# Patient Record
Sex: Female | Born: 1965 | Race: Black or African American | Hispanic: No | Marital: Married | Smoking: Never smoker
Health system: Southern US, Community
[De-identification: ages and names within clinical notes are randomized; demographics above are authoritative.]

## PROBLEM LIST (undated history)

## (undated) DIAGNOSIS — Z8711 Personal history of peptic ulcer disease: Secondary | ICD-10-CM

## (undated) DIAGNOSIS — B019 Varicella without complication: Secondary | ICD-10-CM

## (undated) DIAGNOSIS — G40109 Localization-related (focal) (partial) symptomatic epilepsy and epileptic syndromes with simple partial seizures, not intractable, without status epilepticus: Secondary | ICD-10-CM

## (undated) DIAGNOSIS — E119 Type 2 diabetes mellitus without complications: Secondary | ICD-10-CM

## (undated) DIAGNOSIS — G64 Other disorders of peripheral nervous system: Secondary | ICD-10-CM

## (undated) DIAGNOSIS — Z9189 Other specified personal risk factors, not elsewhere classified: Secondary | ICD-10-CM

## (undated) DIAGNOSIS — D219 Benign neoplasm of connective and other soft tissue, unspecified: Secondary | ICD-10-CM

## (undated) DIAGNOSIS — M1990 Unspecified osteoarthritis, unspecified site: Secondary | ICD-10-CM

## (undated) DIAGNOSIS — F319 Bipolar disorder, unspecified: Secondary | ICD-10-CM

## (undated) DIAGNOSIS — F32A Depression, unspecified: Secondary | ICD-10-CM

## (undated) DIAGNOSIS — Z8719 Personal history of other diseases of the digestive system: Secondary | ICD-10-CM

## (undated) DIAGNOSIS — F329 Major depressive disorder, single episode, unspecified: Secondary | ICD-10-CM

## (undated) DIAGNOSIS — G4730 Sleep apnea, unspecified: Secondary | ICD-10-CM

## (undated) DIAGNOSIS — E7800 Pure hypercholesterolemia, unspecified: Secondary | ICD-10-CM

## (undated) DIAGNOSIS — M549 Dorsalgia, unspecified: Secondary | ICD-10-CM

## (undated) DIAGNOSIS — R569 Unspecified convulsions: Secondary | ICD-10-CM

## (undated) DIAGNOSIS — Z5189 Encounter for other specified aftercare: Secondary | ICD-10-CM

## (undated) HISTORY — DX: Localization-related (focal) (partial) symptomatic epilepsy and epileptic syndromes with simple partial seizures, not intractable, without status epilepticus: G40.109

## (undated) HISTORY — DX: Encounter for other specified aftercare: Z51.89

## (undated) HISTORY — DX: Major depressive disorder, single episode, unspecified: F32.9

## (undated) HISTORY — DX: Type 2 diabetes mellitus without complications: E11.9

## (undated) HISTORY — PX: KNEE SURGERY: SHX244

## (undated) HISTORY — DX: Benign neoplasm of connective and other soft tissue, unspecified: D21.9

## (undated) HISTORY — DX: Varicella without complication: B01.9

## (undated) HISTORY — PX: PARTIAL HYSTERECTOMY: SHX80

## (undated) HISTORY — DX: Personal history of other diseases of the digestive system: Z87.19

## (undated) HISTORY — DX: Dorsalgia, unspecified: M54.9

## (undated) HISTORY — DX: Personal history of peptic ulcer disease: Z87.11

## (undated) HISTORY — PX: CHOLECYSTECTOMY: SHX55

## (undated) HISTORY — DX: Unspecified convulsions: R56.9

## (undated) HISTORY — PX: GASTRIC BYPASS: SHX52

## (undated) HISTORY — DX: Other disorders of peripheral nervous system: G64

## (undated) HISTORY — DX: Depression, unspecified: F32.A

## (undated) HISTORY — DX: Sleep apnea, unspecified: G47.30

## (undated) HISTORY — DX: Other specified personal risk factors, not elsewhere classified: Z91.89

## (undated) HISTORY — DX: Unspecified osteoarthritis, unspecified site: M19.90

---

## 2010-08-11 HISTORY — PX: ABDOMINAL HYSTERECTOMY: SHX81

## 2010-11-12 DIAGNOSIS — E1140 Type 2 diabetes mellitus with diabetic neuropathy, unspecified: Secondary | ICD-10-CM | POA: Insufficient documentation

## 2010-11-12 DIAGNOSIS — D219 Benign neoplasm of connective and other soft tissue, unspecified: Secondary | ICD-10-CM

## 2010-11-12 DIAGNOSIS — G629 Polyneuropathy, unspecified: Secondary | ICD-10-CM | POA: Insufficient documentation

## 2010-11-12 DIAGNOSIS — G64 Other disorders of peripheral nervous system: Secondary | ICD-10-CM | POA: Insufficient documentation

## 2010-11-12 DIAGNOSIS — G4730 Sleep apnea, unspecified: Secondary | ICD-10-CM

## 2010-11-12 DIAGNOSIS — K862 Cyst of pancreas: Secondary | ICD-10-CM | POA: Insufficient documentation

## 2010-11-12 DIAGNOSIS — E119 Type 2 diabetes mellitus without complications: Secondary | ICD-10-CM

## 2010-11-12 DIAGNOSIS — F410 Panic disorder [episodic paroxysmal anxiety] without agoraphobia: Secondary | ICD-10-CM | POA: Insufficient documentation

## 2010-11-12 HISTORY — DX: Sleep apnea, unspecified: G47.30

## 2010-11-12 HISTORY — DX: Benign neoplasm of connective and other soft tissue, unspecified: D21.9

## 2010-11-12 HISTORY — DX: Other disorders of peripheral nervous system: G64

## 2010-11-12 HISTORY — DX: Type 2 diabetes mellitus without complications: E11.9

## 2011-07-29 DIAGNOSIS — G40109 Localization-related (focal) (partial) symptomatic epilepsy and epileptic syndromes with simple partial seizures, not intractable, without status epilepticus: Secondary | ICD-10-CM | POA: Insufficient documentation

## 2011-07-29 HISTORY — DX: Localization-related (focal) (partial) symptomatic epilepsy and epileptic syndromes with simple partial seizures, not intractable, without status epilepticus: G40.109

## 2011-10-21 DIAGNOSIS — Z01818 Encounter for other preprocedural examination: Secondary | ICD-10-CM | POA: Insufficient documentation

## 2011-10-21 DIAGNOSIS — R000 Tachycardia, unspecified: Secondary | ICD-10-CM | POA: Insufficient documentation

## 2011-10-21 DIAGNOSIS — R002 Palpitations: Secondary | ICD-10-CM | POA: Insufficient documentation

## 2011-11-16 DIAGNOSIS — Z79899 Other long term (current) drug therapy: Secondary | ICD-10-CM | POA: Insufficient documentation

## 2011-12-14 DIAGNOSIS — E559 Vitamin D deficiency, unspecified: Secondary | ICD-10-CM | POA: Insufficient documentation

## 2012-05-02 DIAGNOSIS — Z9884 Bariatric surgery status: Secondary | ICD-10-CM | POA: Insufficient documentation

## 2012-06-06 ENCOUNTER — Emergency Department: Payer: Self-pay | Admitting: Emergency Medicine

## 2012-06-06 LAB — URINALYSIS, COMPLETE
Bilirubin,UR: NEGATIVE
Hyaline Cast: 15
Leukocyte Esterase: NEGATIVE
Nitrite: NEGATIVE
Ph: 5 (ref 4.5–8.0)
Protein: NEGATIVE
RBC,UR: 1 /HPF (ref 0–5)
Specific Gravity: 1.019 (ref 1.003–1.030)
Squamous Epithelial: 4

## 2012-06-06 LAB — COMPREHENSIVE METABOLIC PANEL
Albumin: 3.7 g/dL (ref 3.4–5.0)
Anion Gap: 8 (ref 7–16)
Bilirubin,Total: 0.3 mg/dL (ref 0.2–1.0)
Co2: 27 mmol/L (ref 21–32)
Creatinine: 1.66 mg/dL — ABNORMAL HIGH (ref 0.60–1.30)
EGFR (Non-African Amer.): 37 — ABNORMAL LOW
Potassium: 3.9 mmol/L (ref 3.5–5.1)
SGPT (ALT): 37 U/L (ref 12–78)

## 2012-06-06 LAB — CBC WITH DIFFERENTIAL/PLATELET
Basophil #: 0 10*3/uL (ref 0.0–0.1)
Basophil %: 0.5 %
Eosinophil #: 0.1 10*3/uL (ref 0.0–0.7)
HCT: 35.1 % (ref 35.0–47.0)
HGB: 11.6 g/dL — ABNORMAL LOW (ref 12.0–16.0)
Lymphocyte #: 3.1 10*3/uL (ref 1.0–3.6)
MCH: 29 pg (ref 26.0–34.0)
Monocyte %: 7.1 %
Neutrophil %: 56.4 %
Platelet: 378 10*3/uL (ref 150–440)
RBC: 4 10*6/uL (ref 3.80–5.20)
WBC: 8.9 10*3/uL (ref 3.6–11.0)

## 2012-06-06 LAB — PREGNANCY, URINE: Pregnancy Test, Urine: NEGATIVE m[IU]/mL

## 2012-07-10 LAB — COMPREHENSIVE METABOLIC PANEL
Alkaline Phosphatase: 94 U/L (ref 50–136)
Anion Gap: 10 (ref 7–16)
Bilirubin,Total: 0.2 mg/dL (ref 0.2–1.0)
Calcium, Total: 9.1 mg/dL (ref 8.5–10.1)
Chloride: 106 mmol/L (ref 98–107)
Co2: 22 mmol/L (ref 21–32)
Osmolality: 281 (ref 275–301)
Potassium: 3.7 mmol/L (ref 3.5–5.1)
SGPT (ALT): 45 U/L (ref 12–78)
Total Protein: 8.2 g/dL (ref 6.4–8.2)

## 2012-07-10 LAB — CBC
HCT: 38.9 % (ref 35.0–47.0)
RBC: 4.38 10*6/uL (ref 3.80–5.20)
RDW: 15.5 % — ABNORMAL HIGH (ref 11.5–14.5)

## 2012-07-10 LAB — TROPONIN I: Troponin-I: <0.02 ng/mL

## 2012-07-11 ENCOUNTER — Inpatient Hospital Stay: Payer: Self-pay | Admitting: Internal Medicine

## 2012-07-11 DIAGNOSIS — I509 Heart failure, unspecified: Secondary | ICD-10-CM

## 2012-07-11 LAB — URINALYSIS, COMPLETE
Bilirubin,UR: NEGATIVE
Blood: NEGATIVE
Ketone: NEGATIVE
Leukocyte Esterase: NEGATIVE
Nitrite: NEGATIVE
Ph: 5 (ref 4.5–8.0)
RBC,UR: <1 /HPF (ref 0–5)
Specific Gravity: 1.014 (ref 1.003–1.030)
WBC UR: 4 /HPF (ref 0–5)

## 2012-07-11 LAB — TSH: Thyroid Stimulating Horm: 2.26 u[IU]/mL

## 2012-07-11 LAB — PROTEIN / CREATININE RATIO, URINE
Creatinine, Urine: 215 mg/dL — ABNORMAL HIGH (ref 30.0–125.0)
Protein, Random Urine: 45 mg/dL — ABNORMAL HIGH (ref 0–12)
Protein/Creat. Ratio: 209 mg/gCREAT — ABNORMAL HIGH (ref 0–200)

## 2012-07-12 LAB — CBC WITH DIFFERENTIAL/PLATELET
Basophil #: 0 10*3/uL (ref 0.0–0.1)
Basophil %: 0.2 %
Eosinophil #: 0 10*3/uL (ref 0.0–0.7)
Eosinophil %: 0 %
HCT: 34.4 % — ABNORMAL LOW (ref 35.0–47.0)
Lymphocyte #: 1.3 10*3/uL (ref 1.0–3.6)
MCV: 89 fL (ref 80–100)
Monocyte %: 3 %
Neutrophil #: 7 10*3/uL — ABNORMAL HIGH (ref 1.4–6.5)
Neutrophil %: 82 %
Platelet: 388 10*3/uL (ref 150–440)
RDW: 15.4 % — ABNORMAL HIGH (ref 11.5–14.5)

## 2012-07-12 LAB — BASIC METABOLIC PANEL
Anion Gap: 6 — ABNORMAL LOW (ref 7–16)
Chloride: 108 mmol/L — ABNORMAL HIGH (ref 98–107)
Co2: 23 mmol/L (ref 21–32)
Creatinine: 1.32 mg/dL — ABNORMAL HIGH (ref 0.60–1.30)
EGFR (African American): 57 — ABNORMAL LOW
Glucose: 184 mg/dL — ABNORMAL HIGH (ref 65–99)
Osmolality: 282 (ref 275–301)
Potassium: 5.5 mmol/L — ABNORMAL HIGH (ref 3.5–5.1)
Sodium: 137 mmol/L (ref 136–145)

## 2012-07-12 LAB — HEMOGLOBIN A1C: Hemoglobin A1C: 6.6 % — ABNORMAL HIGH (ref 4.2–6.3)

## 2012-07-12 LAB — LIPID PANEL
Cholesterol: 205 mg/dL — ABNORMAL HIGH (ref 0–200)
Triglycerides: 150 mg/dL (ref 0–200)
VLDL Cholesterol, Calc: 30 mg/dL (ref 5–40)

## 2012-08-01 ENCOUNTER — Ambulatory Visit: Payer: Self-pay | Admitting: Pain Medicine

## 2012-08-03 LAB — COMPREHENSIVE METABOLIC PANEL
Albumin: 4.5 g/dL (ref 3.4–5.0)
Anion Gap: 14 (ref 7–16)
Bilirubin,Total: 0.4 mg/dL (ref 0.2–1.0)
Chloride: 95 mmol/L — ABNORMAL LOW (ref 98–107)
EGFR (African American): 53 — ABNORMAL LOW
Glucose: 166 mg/dL — ABNORMAL HIGH (ref 65–99)
Osmolality: 276 (ref 275–301)
Potassium: 3.3 mmol/L — ABNORMAL LOW (ref 3.5–5.1)
Sodium: 135 mmol/L — ABNORMAL LOW (ref 136–145)

## 2012-08-03 LAB — URINALYSIS, COMPLETE
Blood: NEGATIVE
Glucose,UR: NEGATIVE mg/dL (ref 0–75)
Hyaline Cast: 47
Leukocyte Esterase: NEGATIVE
RBC,UR: <1 /HPF (ref 0–5)
Specific Gravity: 1.025 (ref 1.003–1.030)
Squamous Epithelial: 4
WBC UR: 1 /HPF (ref 0–5)

## 2012-08-03 LAB — CBC
MCV: 88 fL (ref 80–100)
Platelet: 467 10*3/uL — ABNORMAL HIGH (ref 150–440)

## 2012-08-03 LAB — LIPASE, BLOOD: Lipase: 222 U/L (ref 73–393)

## 2012-08-04 ENCOUNTER — Inpatient Hospital Stay: Payer: Self-pay | Admitting: Internal Medicine

## 2012-08-09 ENCOUNTER — Ambulatory Visit: Payer: Self-pay | Admitting: Family Medicine

## 2012-08-19 ENCOUNTER — Ambulatory Visit: Payer: Self-pay | Admitting: Family Medicine

## 2012-09-14 ENCOUNTER — Emergency Department: Payer: Self-pay | Admitting: Emergency Medicine

## 2012-09-14 LAB — COMPREHENSIVE METABOLIC PANEL
BUN: 7 mg/dL (ref 7–18)
Bilirubin,Total: 0.2 mg/dL (ref 0.2–1.0)
Calcium, Total: 8.9 mg/dL (ref 8.5–10.1)
Chloride: 108 mmol/L — ABNORMAL HIGH (ref 98–107)
Creatinine: 0.88 mg/dL (ref 0.60–1.30)
EGFR (African American): >60
EGFR (Non-African Amer.): >60
Glucose: 68 mg/dL (ref 65–99)
Osmolality: 279 (ref 275–301)
Potassium: 4.5 mmol/L (ref 3.5–5.1)
SGPT (ALT): 65 U/L (ref 12–78)

## 2012-09-14 LAB — CBC
HCT: 38.1 % (ref 35.0–47.0)
HGB: 12.8 g/dL (ref 12.0–16.0)
MCH: 29.7 pg (ref 26.0–34.0)
MCHC: 33.5 g/dL (ref 32.0–36.0)
RDW: 15.8 % — ABNORMAL HIGH (ref 11.5–14.5)
WBC: 7.8 10*3/uL (ref 3.6–11.0)

## 2012-09-14 LAB — URINALYSIS, COMPLETE
Bilirubin,UR: NEGATIVE
Glucose,UR: NEGATIVE mg/dL (ref 0–75)
Leukocyte Esterase: NEGATIVE
Nitrite: NEGATIVE
Protein: NEGATIVE
RBC,UR: 1 /HPF (ref 0–5)
Specific Gravity: 1.009 (ref 1.003–1.030)

## 2012-09-14 LAB — TROPONIN I: Troponin-I: <0.02 ng/mL

## 2012-09-14 LAB — CK TOTAL AND CKMB (NOT AT ARMC)
CK, Total: 178 U/L (ref 21–215)
CK-MB: 1 ng/mL (ref 0.5–3.6)

## 2012-09-16 ENCOUNTER — Emergency Department: Payer: Self-pay | Admitting: Internal Medicine

## 2012-09-16 LAB — BASIC METABOLIC PANEL
Anion Gap: 6 — ABNORMAL LOW (ref 7–16)
Calcium, Total: 8.9 mg/dL (ref 8.5–10.1)
Chloride: 104 mmol/L (ref 98–107)
Creatinine: 0.96 mg/dL (ref 0.60–1.30)
EGFR (African American): >60
EGFR (Non-African Amer.): >60
Osmolality: 274 (ref 275–301)
Potassium: 4.2 mmol/L (ref 3.5–5.1)

## 2012-09-16 LAB — URINALYSIS, COMPLETE
Bilirubin,UR: NEGATIVE
Blood: NEGATIVE
Glucose,UR: NEGATIVE mg/dL (ref 0–75)
Ketone: NEGATIVE
Leukocyte Esterase: NEGATIVE
Ph: 6 (ref 4.5–8.0)
Protein: NEGATIVE
RBC,UR: <1 /HPF (ref 0–5)
Specific Gravity: 1.011 (ref 1.003–1.030)

## 2012-09-16 LAB — DRUG SCREEN, URINE
Barbiturates, Ur Screen: NEGATIVE (ref ?–200)
Benzodiazepine, Ur Scrn: NEGATIVE (ref ?–200)
Cocaine Metabolite,Ur ~~LOC~~: NEGATIVE (ref ?–300)
Methadone, Ur Screen: NEGATIVE (ref ?–300)
Opiate, Ur Screen: NEGATIVE (ref ?–300)
Phencyclidine (PCP) Ur S: NEGATIVE (ref ?–25)
Tricyclic, Ur Screen: POSITIVE (ref ?–1000)

## 2012-09-16 LAB — CBC
HCT: 38.4 % (ref 35.0–47.0)
HGB: 13 g/dL (ref 12.0–16.0)
MCH: 30.3 pg (ref 26.0–34.0)
Platelet: 462 10*3/uL — ABNORMAL HIGH (ref 150–440)

## 2012-09-16 LAB — TROPONIN I: Troponin-I: <0.02 ng/mL

## 2012-10-09 LAB — URINALYSIS, COMPLETE
Glucose,UR: 50 mg/dL (ref 0–75)
Ketone: NEGATIVE
Nitrite: NEGATIVE
Protein: 30
RBC,UR: 2 /HPF (ref 0–5)
Specific Gravity: 1.03 (ref 1.003–1.030)
Squamous Epithelial: 7
WBC UR: 6 /HPF (ref 0–5)

## 2012-10-09 LAB — COMPREHENSIVE METABOLIC PANEL
Albumin: 3.5 g/dL (ref 3.4–5.0)
Alkaline Phosphatase: 92 U/L (ref 50–136)
Anion Gap: 5 — ABNORMAL LOW (ref 7–16)
BUN: 13 mg/dL (ref 7–18)
Bilirubin,Total: 0.3 mg/dL (ref 0.2–1.0)
Calcium, Total: 8.6 mg/dL (ref 8.5–10.1)
EGFR (African American): >60
EGFR (Non-African Amer.): 56 — ABNORMAL LOW
Glucose: 86 mg/dL (ref 65–99)
Osmolality: 277 (ref 275–301)
Potassium: 3.2 mmol/L — ABNORMAL LOW (ref 3.5–5.1)
SGOT(AST): 39 U/L — ABNORMAL HIGH (ref 15–37)
Sodium: 139 mmol/L (ref 136–145)
Total Protein: 7.3 g/dL (ref 6.4–8.2)

## 2012-10-09 LAB — CBC
MCH: 30.1 pg (ref 26.0–34.0)
MCHC: 33.1 g/dL (ref 32.0–36.0)
MCV: 91 fL (ref 80–100)
Platelet: 348 10*3/uL (ref 150–440)
RBC: 3.75 10*6/uL — ABNORMAL LOW (ref 3.80–5.20)
WBC: 7.1 10*3/uL (ref 3.6–11.0)

## 2012-10-10 ENCOUNTER — Observation Stay: Payer: Self-pay | Admitting: Internal Medicine

## 2012-10-10 LAB — BASIC METABOLIC PANEL
Anion Gap: 4 — ABNORMAL LOW (ref 7–16)
BUN: 11 mg/dL (ref 7–18)
Calcium, Total: 8.7 mg/dL (ref 8.5–10.1)
Creatinine: 1.1 mg/dL (ref 0.60–1.30)
EGFR (African American): >60
EGFR (Non-African Amer.): >60
Sodium: 140 mmol/L (ref 136–145)

## 2012-10-11 LAB — URINE CULTURE

## 2012-10-20 ENCOUNTER — Emergency Department: Payer: Self-pay | Admitting: Emergency Medicine

## 2012-10-20 LAB — COMPREHENSIVE METABOLIC PANEL
BUN: 14 mg/dL (ref 7–18)
Bilirubin,Total: 0.2 mg/dL (ref 0.2–1.0)
Calcium, Total: 8.8 mg/dL (ref 8.5–10.1)
Co2: 28 mmol/L (ref 21–32)
EGFR (African American): >60
EGFR (Non-African Amer.): >60
Osmolality: 284 (ref 275–301)
SGPT (ALT): 44 U/L (ref 12–78)
Total Protein: 7.1 g/dL (ref 6.4–8.2)

## 2012-10-20 LAB — CBC
HCT: 33.5 % — ABNORMAL LOW (ref 35.0–47.0)
HGB: 11.5 g/dL — ABNORMAL LOW (ref 12.0–16.0)
MCH: 30.9 pg (ref 26.0–34.0)
MCHC: 34.2 g/dL (ref 32.0–36.0)
RBC: 3.71 10*6/uL — ABNORMAL LOW (ref 3.80–5.20)
RDW: 13.9 % (ref 11.5–14.5)

## 2012-10-20 LAB — URINALYSIS, COMPLETE
Blood: NEGATIVE
Glucose,UR: NEGATIVE mg/dL (ref 0–75)
Leukocyte Esterase: NEGATIVE
Nitrite: NEGATIVE
RBC,UR: 1 /HPF (ref 0–5)
WBC UR: <1 /HPF (ref 0–5)

## 2012-10-20 LAB — CK TOTAL AND CKMB (NOT AT ARMC): CK, Total: 311 U/L — ABNORMAL HIGH (ref 21–215)

## 2012-10-20 LAB — TROPONIN I: Troponin-I: <0.02 ng/mL

## 2012-11-18 ENCOUNTER — Emergency Department: Payer: Self-pay | Admitting: Emergency Medicine

## 2012-11-18 LAB — BASIC METABOLIC PANEL
Anion Gap: 9 (ref 7–16)
BUN: 9 mg/dL (ref 7–18)
Calcium, Total: 9.1 mg/dL (ref 8.5–10.1)
Chloride: 104 mmol/L (ref 98–107)
EGFR (African American): >60
EGFR (Non-African Amer.): 56 — ABNORMAL LOW
Glucose: 238 mg/dL — ABNORMAL HIGH (ref 65–99)
Osmolality: 277 (ref 275–301)

## 2012-11-18 LAB — CBC
HGB: 12.2 g/dL (ref 12.0–16.0)
MCH: 30.3 pg (ref 26.0–34.0)
MCHC: 33.8 g/dL (ref 32.0–36.0)
MCV: 90 fL (ref 80–100)
RBC: 4.02 10*6/uL (ref 3.80–5.20)

## 2012-11-18 LAB — TROPONIN I: Troponin-I: <0.02 ng/mL

## 2012-11-19 LAB — TROPONIN I: Troponin-I: <0.02 ng/mL

## 2012-12-08 ENCOUNTER — Emergency Department: Payer: Self-pay | Admitting: Emergency Medicine

## 2012-12-08 LAB — COMPREHENSIVE METABOLIC PANEL
Albumin: 3.8 g/dL (ref 3.4–5.0)
BUN: 14 mg/dL (ref 7–18)
Bilirubin,Total: 0.3 mg/dL (ref 0.2–1.0)
Calcium, Total: 9.1 mg/dL (ref 8.5–10.1)
Chloride: 107 mmol/L (ref 98–107)
Co2: 26 mmol/L (ref 21–32)
Creatinine: 1.17 mg/dL (ref 0.60–1.30)
EGFR (Non-African Amer.): 56 — ABNORMAL LOW
SGOT(AST): 125 U/L — ABNORMAL HIGH (ref 15–37)
SGPT (ALT): 99 U/L — ABNORMAL HIGH (ref 12–78)
Sodium: 137 mmol/L (ref 136–145)
Total Protein: 8.4 g/dL — ABNORMAL HIGH (ref 6.4–8.2)

## 2012-12-08 LAB — CBC
HCT: 37.3 % (ref 35.0–47.0)
HGB: 12.5 g/dL (ref 12.0–16.0)
MCH: 29.8 pg (ref 26.0–34.0)
MCV: 89 fL (ref 80–100)
RDW: 13.3 % (ref 11.5–14.5)

## 2012-12-08 LAB — URINALYSIS, COMPLETE
Bilirubin,UR: NEGATIVE
Glucose,UR: NEGATIVE mg/dL (ref 0–75)
Ketone: NEGATIVE
Protein: NEGATIVE
RBC,UR: <1 /HPF (ref 0–5)

## 2013-01-30 ENCOUNTER — Ambulatory Visit: Payer: Self-pay | Admitting: Pain Medicine

## 2013-02-28 LAB — BASIC METABOLIC PANEL
Anion Gap: 6 — ABNORMAL LOW (ref 7–16)
BUN: 12 mg/dL (ref 7–18)
Calcium, Total: 8.9 mg/dL (ref 8.5–10.1)
Chloride: 101 mmol/L (ref 98–107)
Creatinine: 1.17 mg/dL (ref 0.60–1.30)
EGFR (Non-African Amer.): 56 — ABNORMAL LOW
Glucose: 282 mg/dL — ABNORMAL HIGH (ref 65–99)
Potassium: 3.9 mmol/L (ref 3.5–5.1)
Sodium: 135 mmol/L — ABNORMAL LOW (ref 136–145)

## 2013-02-28 LAB — CBC WITH DIFFERENTIAL/PLATELET
Basophil #: 0 10*3/uL (ref 0.0–0.1)
Basophil %: 0.3 %
Eosinophil %: 0.3 %
HCT: 35.6 % (ref 35.0–47.0)
HGB: 11.9 g/dL — ABNORMAL LOW (ref 12.0–16.0)
Lymphocyte %: 14 %
MCH: 30.2 pg (ref 26.0–34.0)
MCHC: 33.5 g/dL (ref 32.0–36.0)
MCV: 90 fL (ref 80–100)
Monocyte #: 0.6 x10 3/mm (ref 0.2–0.9)
Monocyte %: 5.2 %
Neutrophil %: 80.2 %
RDW: 13.4 % (ref 11.5–14.5)

## 2013-02-28 LAB — TROPONIN I: Troponin-I: <0.02 ng/mL

## 2013-03-01 LAB — URINALYSIS, COMPLETE
Bacteria: NONE SEEN
Blood: NEGATIVE
Leukocyte Esterase: NEGATIVE
Ph: 5 (ref 4.5–8.0)
Specific Gravity: 1.048 (ref 1.003–1.030)
Squamous Epithelial: 3

## 2013-03-01 LAB — TROPONIN I: Troponin-I: <0.02 ng/mL

## 2013-03-01 LAB — CK TOTAL AND CKMB (NOT AT ARMC)
CK, Total: 340 U/L — ABNORMAL HIGH (ref 21–215)
CK, Total: 1153 U/L — ABNORMAL HIGH (ref 21–215)
CK-MB: 5.2 ng/mL — ABNORMAL HIGH (ref 0.5–3.6)

## 2013-03-01 LAB — DRUG SCREEN, URINE
Barbiturates, Ur Screen: NEGATIVE (ref ?–200)
Opiate, Ur Screen: POSITIVE (ref ?–300)
Phencyclidine (PCP) Ur S: NEGATIVE (ref ?–25)
Tricyclic, Ur Screen: POSITIVE (ref ?–1000)

## 2013-03-02 ENCOUNTER — Inpatient Hospital Stay: Payer: Self-pay | Admitting: Internal Medicine

## 2013-03-02 LAB — BASIC METABOLIC PANEL
Co2: 27 mmol/L (ref 21–32)
EGFR (African American): >60
Glucose: 174 mg/dL — ABNORMAL HIGH (ref 65–99)
Potassium: 4.1 mmol/L (ref 3.5–5.1)

## 2013-03-02 LAB — LIPID PANEL
Cholesterol: 157 mg/dL (ref 0–200)
VLDL Cholesterol, Calc: 32 mg/dL (ref 5–40)

## 2013-03-03 ENCOUNTER — Ambulatory Visit: Payer: Self-pay | Admitting: Neurology

## 2013-03-03 LAB — CK: CK, Total: 1597 U/L — ABNORMAL HIGH (ref 21–215)

## 2013-03-08 ENCOUNTER — Emergency Department: Payer: Self-pay | Admitting: Emergency Medicine

## 2013-03-11 ENCOUNTER — Emergency Department: Payer: Self-pay | Admitting: Emergency Medicine

## 2013-03-16 ENCOUNTER — Observation Stay: Payer: Self-pay | Admitting: Internal Medicine

## 2013-03-16 LAB — BASIC METABOLIC PANEL
Anion Gap: 6 — ABNORMAL LOW (ref 7–16)
BUN: 14 mg/dL (ref 7–18)
Co2: 27 mmol/L (ref 21–32)
EGFR (African American): 58 — ABNORMAL LOW
EGFR (Non-African Amer.): 50 — ABNORMAL LOW
Glucose: 50 mg/dL — ABNORMAL LOW (ref 65–99)
Osmolality: 273 (ref 275–301)
Potassium: 3.8 mmol/L (ref 3.5–5.1)
Sodium: 138 mmol/L (ref 136–145)

## 2013-03-16 LAB — URINALYSIS, COMPLETE
Bilirubin,UR: NEGATIVE
Blood: NEGATIVE
Hyaline Cast: 6
Leukocyte Esterase: NEGATIVE
Protein: NEGATIVE
RBC,UR: <1 /HPF (ref 0–5)
Squamous Epithelial: <1
WBC UR: 1 /HPF (ref 0–5)

## 2013-03-16 LAB — ETHANOL: Ethanol %: <0.003 % (ref 0.000–0.080)

## 2013-03-16 LAB — DRUG SCREEN, URINE
Amphetamines, Ur Screen: NEGATIVE (ref ?–1000)
Barbiturates, Ur Screen: NEGATIVE (ref ?–200)
Cannabinoid 50 Ng, Ur ~~LOC~~: NEGATIVE (ref ?–50)
Cocaine Metabolite,Ur ~~LOC~~: NEGATIVE (ref ?–300)
MDMA (Ecstasy)Ur Screen: NEGATIVE (ref ?–500)
Methadone, Ur Screen: NEGATIVE (ref ?–300)
Phencyclidine (PCP) Ur S: NEGATIVE (ref ?–25)

## 2013-03-16 LAB — PHENYTOIN LEVEL, TOTAL: Dilantin: 3.7 ug/mL — ABNORMAL LOW (ref 10.0–20.0)

## 2013-03-16 LAB — CBC
MCH: 30.2 pg (ref 26.0–34.0)
RBC: 3.41 10*6/uL — ABNORMAL LOW (ref 3.80–5.20)

## 2013-03-17 LAB — BASIC METABOLIC PANEL
BUN: 14 mg/dL (ref 7–18)
Calcium, Total: 8.7 mg/dL (ref 8.5–10.1)
Creatinine: 1.12 mg/dL (ref 0.60–1.30)
EGFR (African American): >60
EGFR (Non-African Amer.): 59 — ABNORMAL LOW
Glucose: 118 mg/dL — ABNORMAL HIGH (ref 65–99)
Osmolality: 281 (ref 275–301)
Potassium: 3.9 mmol/L (ref 3.5–5.1)

## 2013-03-17 LAB — CBC WITH DIFFERENTIAL/PLATELET
Basophil #: 0.1 10*3/uL (ref 0.0–0.1)
Basophil %: 1.1 %
Eosinophil %: 1.8 %
HCT: 30.9 % — ABNORMAL LOW (ref 35.0–47.0)
HGB: 10.3 g/dL — ABNORMAL LOW (ref 12.0–16.0)
MCHC: 33.2 g/dL (ref 32.0–36.0)
MCV: 90 fL (ref 80–100)
Monocyte #: 0.4 x10 3/mm (ref 0.2–0.9)
Neutrophil #: 4 10*3/uL (ref 1.4–6.5)
Neutrophil %: 58.5 %
Platelet: 468 10*3/uL — ABNORMAL HIGH (ref 150–440)
RBC: 3.42 10*6/uL — ABNORMAL LOW (ref 3.80–5.20)
RDW: 13.9 % (ref 11.5–14.5)

## 2013-03-19 ENCOUNTER — Emergency Department: Payer: Self-pay | Admitting: Emergency Medicine

## 2013-03-19 LAB — CBC
HCT: 31.5 % — ABNORMAL LOW (ref 35.0–47.0)
HGB: 10.5 g/dL — ABNORMAL LOW (ref 12.0–16.0)
MCH: 30 pg (ref 26.0–34.0)
MCHC: 33.2 g/dL (ref 32.0–36.0)
MCV: 90 fL (ref 80–100)
Platelet: 498 10*3/uL — ABNORMAL HIGH (ref 150–440)
RBC: 3.49 10*6/uL — ABNORMAL LOW (ref 3.80–5.20)
RDW: 13.8 % (ref 11.5–14.5)

## 2013-03-19 LAB — BASIC METABOLIC PANEL
Anion Gap: 7 (ref 7–16)
BUN: 16 mg/dL (ref 7–18)
Calcium, Total: 8.7 mg/dL (ref 8.5–10.1)
Chloride: 106 mmol/L (ref 98–107)
Creatinine: 1.23 mg/dL (ref 0.60–1.30)
EGFR (African American): >60
EGFR (Non-African Amer.): 53 — ABNORMAL LOW
Glucose: 59 mg/dL — ABNORMAL LOW (ref 65–99)
Sodium: 142 mmol/L (ref 136–145)

## 2013-03-19 LAB — TROPONIN I: Troponin-I: <0.02 ng/mL

## 2013-03-19 LAB — URINE CULTURE

## 2013-03-20 ENCOUNTER — Inpatient Hospital Stay: Payer: Self-pay | Admitting: Student

## 2013-03-20 LAB — URINALYSIS, COMPLETE
Blood: NEGATIVE
Glucose,UR: 50 mg/dL (ref 0–75)
Hyaline Cast: 3
Ph: 5 (ref 4.5–8.0)
RBC,UR: <1 /HPF (ref 0–5)
Squamous Epithelial: 1

## 2013-03-20 LAB — CK TOTAL AND CKMB (NOT AT ARMC): CK, Total: 3898 U/L — ABNORMAL HIGH (ref 21–215)

## 2013-03-20 LAB — CBC
HCT: 21 % — ABNORMAL LOW (ref 35.0–47.0)
MCHC: 33 g/dL (ref 32.0–36.0)
MCV: 92 fL (ref 80–100)
RDW: 14.1 % (ref 11.5–14.5)

## 2013-03-20 LAB — MAGNESIUM: Magnesium: 1.8 mg/dL

## 2013-03-20 LAB — BASIC METABOLIC PANEL
Anion Gap: 9 (ref 7–16)
BUN: 14 mg/dL (ref 7–18)
Co2: 23 mmol/L (ref 21–32)
Glucose: 66 mg/dL (ref 65–99)
Osmolality: 278 (ref 275–301)
Sodium: 140 mmol/L (ref 136–145)

## 2013-03-20 LAB — TROPONIN I
Troponin-I: <0.02 ng/mL
Troponin-I: <0.02 ng/mL

## 2013-03-21 DIAGNOSIS — R079 Chest pain, unspecified: Secondary | ICD-10-CM

## 2013-03-21 LAB — CBC WITH DIFFERENTIAL/PLATELET
BASOS ABS: 0 10*3/uL (ref 0.0–0.1)
BASOS ABS: 0 10*3/uL (ref 0.0–0.1)
BASOS PCT: 0.6 %
Basophil %: 0.6 %
EOS ABS: 0.2 10*3/uL (ref 0.0–0.7)
EOS ABS: 0.2 10*3/uL (ref 0.0–0.7)
Eosinophil %: 2.7 %
Eosinophil %: 2.2 %
HCT: 33.5 % — ABNORMAL LOW (ref 35.0–47.0)
HCT: 33.9 % — ABNORMAL LOW (ref 35.0–47.0)
HGB: 11.1 g/dL — AB (ref 12.0–16.0)
HGB: 11.3 g/dL — ABNORMAL LOW (ref 12.0–16.0)
LYMPHS PCT: 33 %
Lymphocyte #: 2.4 10*3/uL (ref 1.0–3.6)
Lymphocyte #: 2.4 10*3/uL (ref 1.0–3.6)
Lymphocyte %: 41.3 %
MCH: 29.5 pg (ref 26.0–34.0)
MCH: 29.9 pg (ref 26.0–34.0)
MCHC: 33.1 g/dL (ref 32.0–36.0)
MCHC: 33.4 g/dL (ref 32.0–36.0)
MCV: 89 fL (ref 80–100)
MCV: 89 fL (ref 80–100)
MONO ABS: 0.5 x10 3/mm (ref 0.2–0.9)
Monocyte #: 0.4 x10 3/mm (ref 0.2–0.9)
Monocyte %: 7.4 %
Monocyte %: 7.1 %
NEUTROS PCT: 48 %
NEUTROS PCT: 57.1 %
Neutrophil #: 2.7 10*3/uL (ref 1.4–6.5)
Neutrophil #: 4.1 10*3/uL (ref 1.4–6.5)
PLATELETS: 421 10*3/uL (ref 150–440)
Platelet: 438 10*3/uL (ref 150–440)
RBC: 3.76 10*6/uL — ABNORMAL LOW (ref 3.80–5.20)
RBC: 3.79 10*6/uL — AB (ref 3.80–5.20)
RDW: 14.6 % — AB (ref 11.5–14.5)
RDW: 14.3 % (ref 11.5–14.5)
WBC: 5.7 10*3/uL (ref 3.6–11.0)
WBC: 7.1 10*3/uL (ref 3.6–11.0)

## 2013-03-21 LAB — LIPID PANEL
Cholesterol: 146 mg/dL (ref 0–200)
HDL Cholesterol: 48 mg/dL (ref 40–60)
Ldl Cholesterol, Calc: 62 mg/dL (ref 0–100)
Triglycerides: 179 mg/dL (ref 0–200)
VLDL Cholesterol, Calc: 36 mg/dL (ref 5–40)

## 2013-03-21 LAB — TROPONIN I
Troponin-I: <0.02 ng/mL
Troponin-I: <0.02 ng/mL

## 2013-03-21 LAB — CK TOTAL AND CKMB (NOT AT ARMC)
CK, Total: 3283 U/L — ABNORMAL HIGH (ref 21–215)
CK-MB: 9.3 ng/mL — ABNORMAL HIGH (ref 0.5–3.6)
CK-MB: 7 ng/mL — ABNORMAL HIGH (ref 0.5–3.6)

## 2013-03-22 LAB — CBC WITH DIFFERENTIAL/PLATELET
Basophil #: 0 10*3/uL (ref 0.0–0.1)
Basophil %: 0.4 %
EOS PCT: 2 %
Eosinophil #: 0.1 10*3/uL (ref 0.0–0.7)
HCT: 32.8 % — ABNORMAL LOW (ref 35.0–47.0)
HGB: 10.7 g/dL — AB (ref 12.0–16.0)
LYMPHS ABS: 1.8 10*3/uL (ref 1.0–3.6)
Lymphocyte %: 26.7 %
MCH: 29.3 pg (ref 26.0–34.0)
MCHC: 32.7 g/dL (ref 32.0–36.0)
MCV: 90 fL (ref 80–100)
MONOS PCT: 6.5 %
Monocyte #: 0.4 x10 3/mm (ref 0.2–0.9)
NEUTROS ABS: 4.3 10*3/uL (ref 1.4–6.5)
Neutrophil %: 64.4 %
PLATELETS: 412 10*3/uL (ref 150–440)
RBC: 3.66 10*6/uL — ABNORMAL LOW (ref 3.80–5.20)
RDW: 13.9 % (ref 11.5–14.5)
WBC: 6.7 10*3/uL (ref 3.6–11.0)

## 2013-03-22 LAB — IRON AND TIBC
IRON BIND. CAP.(TOTAL): 312 ug/dL (ref 250–450)
IRON SATURATION: 19 %
Iron: 59 ug/dL (ref 50–170)
UNBOUND IRON-BIND. CAP.: 253 ug/dL

## 2013-03-22 LAB — FERRITIN: FERRITIN (ARMC): 114 ng/mL (ref 8–388)

## 2013-03-22 LAB — CK: CK, Total: 1425 U/L — ABNORMAL HIGH (ref 21–215)

## 2013-03-22 LAB — FOLATE: FOLIC ACID: 8.9 ng/mL (ref 3.1–100.0)

## 2013-03-26 ENCOUNTER — Ambulatory Visit: Payer: Self-pay | Admitting: Gastroenterology

## 2013-04-08 DIAGNOSIS — F319 Bipolar disorder, unspecified: Secondary | ICD-10-CM | POA: Insufficient documentation

## 2013-12-17 ENCOUNTER — Emergency Department: Payer: Self-pay | Admitting: Internal Medicine

## 2013-12-17 LAB — CBC
HCT: 36.9 % (ref 35.0–47.0)
HGB: 12.1 g/dL (ref 12.0–16.0)
MCH: 29.2 pg (ref 26.0–34.0)
MCHC: 32.7 g/dL (ref 32.0–36.0)
MCV: 89 fL (ref 80–100)
Platelet: 363 10*3/uL (ref 150–440)
RBC: 4.13 10*6/uL (ref 3.80–5.20)
RDW: 13.5 % (ref 11.5–14.5)
WBC: 7.4 10*3/uL (ref 3.6–11.0)

## 2013-12-17 LAB — BASIC METABOLIC PANEL
Anion Gap: 6 — ABNORMAL LOW (ref 7–16)
BUN: 6 mg/dL — ABNORMAL LOW (ref 7–18)
CHLORIDE: 108 mmol/L — AB (ref 98–107)
CO2: 27 mmol/L (ref 21–32)
CREATININE: 0.93 mg/dL (ref 0.60–1.30)
Calcium, Total: 8.9 mg/dL (ref 8.5–10.1)
EGFR (African American): >60
GLUCOSE: 71 mg/dL (ref 65–99)
Osmolality: 277 (ref 275–301)
Potassium: 3.9 mmol/L (ref 3.5–5.1)
Sodium: 141 mmol/L (ref 136–145)

## 2013-12-17 LAB — TROPONIN I

## 2014-04-21 ENCOUNTER — Observation Stay: Payer: Self-pay | Admitting: Internal Medicine

## 2014-04-21 LAB — DRUG SCREEN, URINE

## 2014-04-21 LAB — URINALYSIS, COMPLETE
BACTERIA: NONE SEEN
Bilirubin,UR: NEGATIVE
Blood: NEGATIVE
GLUCOSE, UR: NEGATIVE mg/dL (ref 0–75)
Ketone: NEGATIVE
LEUKOCYTE ESTERASE: NEGATIVE
Nitrite: NEGATIVE
PH: 6 (ref 4.5–8.0)
Protein: NEGATIVE
RBC,UR: NONE SEEN /HPF (ref 0–5)
Specific Gravity: 1.008 (ref 1.003–1.030)
WBC UR: <1 /HPF (ref 0–5)

## 2014-04-21 LAB — HEPATIC FUNCTION PANEL A (ARMC)
ALK PHOS: 119 U/L — AB (ref 46–116)
Albumin: 3.4 g/dL (ref 3.4–5.0)
BILIRUBIN TOTAL: 0.1 mg/dL — AB (ref 0.2–1.0)
SGOT(AST): 35 U/L (ref 15–37)
SGPT (ALT): 30 U/L (ref 14–63)
TOTAL PROTEIN: 7.8 g/dL (ref 6.4–8.2)

## 2014-04-21 LAB — COMPREHENSIVE METABOLIC PANEL
ALT: 34 U/L (ref 14–63)
AST: 34 U/L (ref 15–37)
Albumin: 3.4 g/dL (ref 3.4–5.0)
Alkaline Phosphatase: 121 U/L — ABNORMAL HIGH (ref 46–116)
Anion Gap: 10 (ref 7–16)
BUN: 12 mg/dL (ref 7–18)
Bilirubin,Total: 0.1 mg/dL — ABNORMAL LOW (ref 0.2–1.0)
CALCIUM: 8.9 mg/dL (ref 8.5–10.1)
CHLORIDE: 104 mmol/L (ref 98–107)
Co2: 23 mmol/L (ref 21–32)
Creatinine: 1.19 mg/dL (ref 0.60–1.30)
EGFR (African American): >60
EGFR (Non-African Amer.): 52 — ABNORMAL LOW
Glucose: 194 mg/dL — ABNORMAL HIGH (ref 65–99)
Osmolality: 279 (ref 275–301)
Potassium: 4.3 mmol/L (ref 3.5–5.1)
Sodium: 137 mmol/L (ref 136–145)
Total Protein: 7.7 g/dL (ref 6.4–8.2)

## 2014-04-21 LAB — TROPONIN I

## 2014-04-21 LAB — APTT: Activated PTT: <23 s — ABNORMAL LOW (ref 23.6–35.9)

## 2014-04-21 LAB — CBC
HCT: 36.3 % (ref 35.0–47.0)
HGB: 11.9 g/dL — AB (ref 12.0–16.0)
MCH: 29 pg (ref 26.0–34.0)
MCHC: 32.6 g/dL (ref 32.0–36.0)
MCV: 89 fL (ref 80–100)
Platelet: 347 10*3/uL (ref 150–440)
RBC: 4.09 10*6/uL (ref 3.80–5.20)
RDW: 14.2 % (ref 11.5–14.5)
WBC: 9.6 10*3/uL (ref 3.6–11.0)

## 2014-04-21 LAB — PROTIME-INR
INR: 0.9
Prothrombin Time: 12.6 s

## 2014-04-21 LAB — ETHANOL

## 2014-04-21 LAB — HEMOGLOBIN A1C: Hemoglobin A1C: 7.8 % — ABNORMAL HIGH (ref 4.2–6.3)

## 2014-04-22 LAB — BASIC METABOLIC PANEL
Anion Gap: 8 (ref 7–16)
BUN: 11 mg/dL (ref 7–18)
Calcium, Total: 8.8 mg/dL (ref 8.5–10.1)
Chloride: 103 mmol/L (ref 98–107)
Co2: 28 mmol/L (ref 21–32)
Creatinine: 1.14 mg/dL (ref 0.60–1.30)
EGFR (Non-African Amer.): 55 — ABNORMAL LOW
GLUCOSE: 180 mg/dL — AB (ref 65–99)
OSMOLALITY: 281 (ref 275–301)
Potassium: 4.2 mmol/L (ref 3.5–5.1)
Sodium: 139 mmol/L (ref 136–145)

## 2014-04-22 LAB — CBC WITH DIFFERENTIAL/PLATELET
Basophil #: 0.1 10*3/uL (ref 0.0–0.1)
Basophil %: 0.8 %
EOS ABS: 0.1 10*3/uL (ref 0.0–0.7)
Eosinophil %: 0.8 %
HCT: 36 % (ref 35.0–47.0)
HGB: 11.8 g/dL — ABNORMAL LOW (ref 12.0–16.0)
LYMPHS ABS: 2.2 10*3/uL (ref 1.0–3.6)
Lymphocyte %: 28.3 %
MCH: 29 pg (ref 26.0–34.0)
MCHC: 32.8 g/dL (ref 32.0–36.0)
MCV: 89 fL (ref 80–100)
Monocyte #: 0.6 x10 3/mm (ref 0.2–0.9)
Monocyte %: 7.8 %
Neutrophil #: 4.9 10*3/uL (ref 1.4–6.5)
Neutrophil %: 62.3 %
Platelet: 344 10*3/uL (ref 150–440)
RBC: 4.06 10*6/uL (ref 3.80–5.20)
RDW: 13.9 % (ref 11.5–14.5)
WBC: 7.9 10*3/uL (ref 3.6–11.0)

## 2014-04-22 LAB — HEMOGLOBIN A1C: HEMOGLOBIN A1C: 8.4 % — AB (ref 4.2–6.3)

## 2014-04-23 IMAGING — CR DG CHEST 2V
1 series · 2 of 2 positions shown · non-contrast
Comparison: none

REASON FOR EXAM: chest pain
COMMENTS:

[Series 1: x chest ap · 0.14mm/px · 2 of 2 slices shown]
[im 1/2]
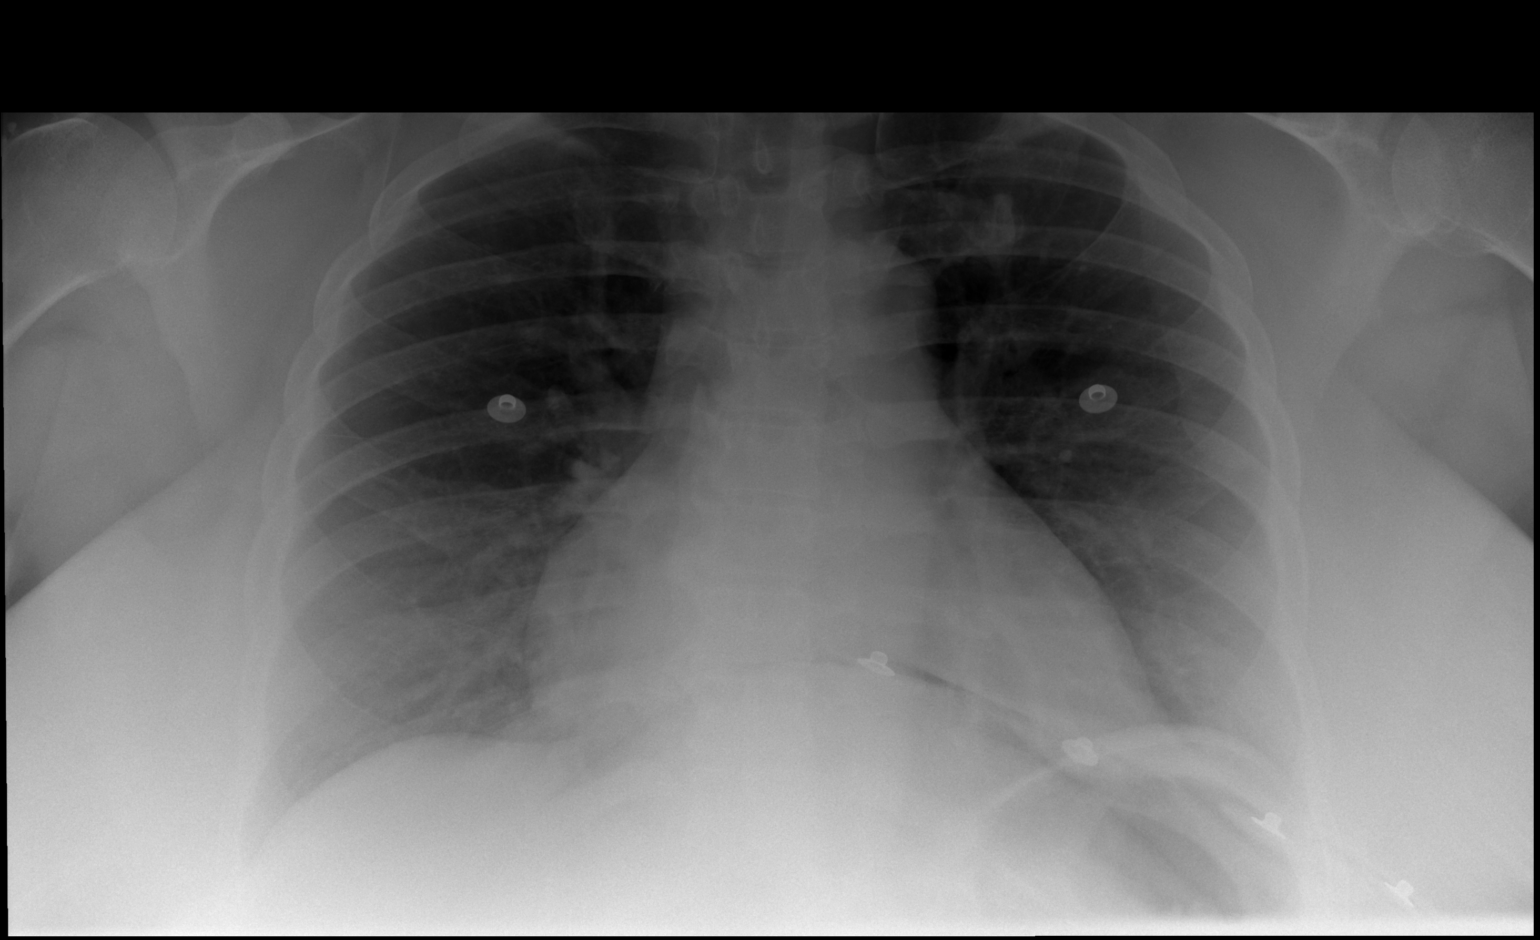
[im 2/2]
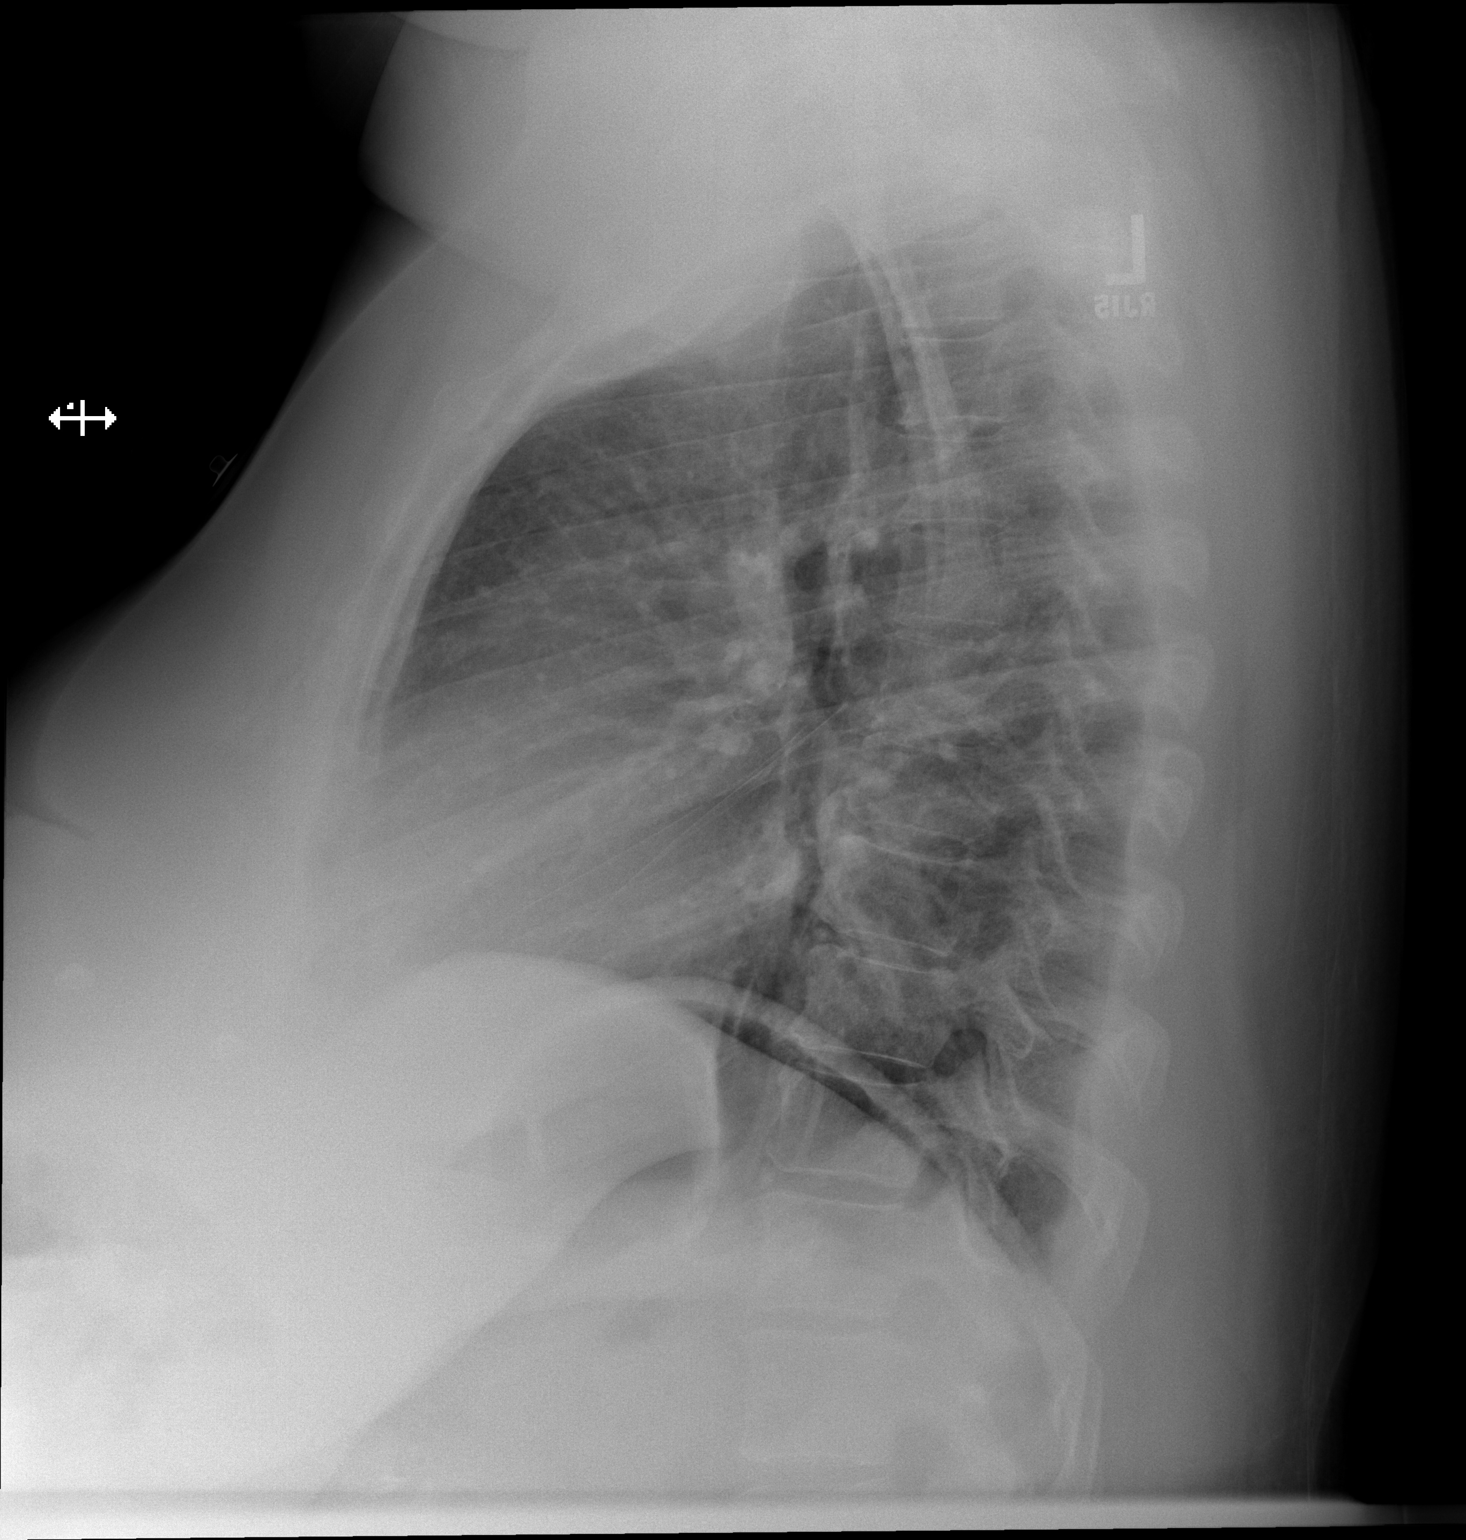

[2 of 2 positions shown; findings below may reference images not displayed]

PROCEDURE:     DXR - DXR CHEST PA (OR AP) AND LATERAL  - September 14, 2012  [DATE]

RESULT:     Comparison is made to the study of 08/04/2012.

The lungs are clear. The heart and pulmonary vessels are normal. The bony
and mediastinal structures are unremarkable. There is no effusion. There is
no pneumothorax or evidence of congestive failure.
IMPRESSION: No acute cardiopulmonary disease.

[REDACTED]

## 2014-04-26 LAB — CULTURE, BLOOD (SINGLE)

## 2014-05-29 IMAGING — CR DG CHEST 2V
1 series · 2 of 2 positions shown · non-contrast
Comparison: none

REASON FOR EXAM: chest pain
COMMENTS:

[Series 1: x chest ap · 0.14mm/px · 2 of 2 slices shown]
[im 1/2]
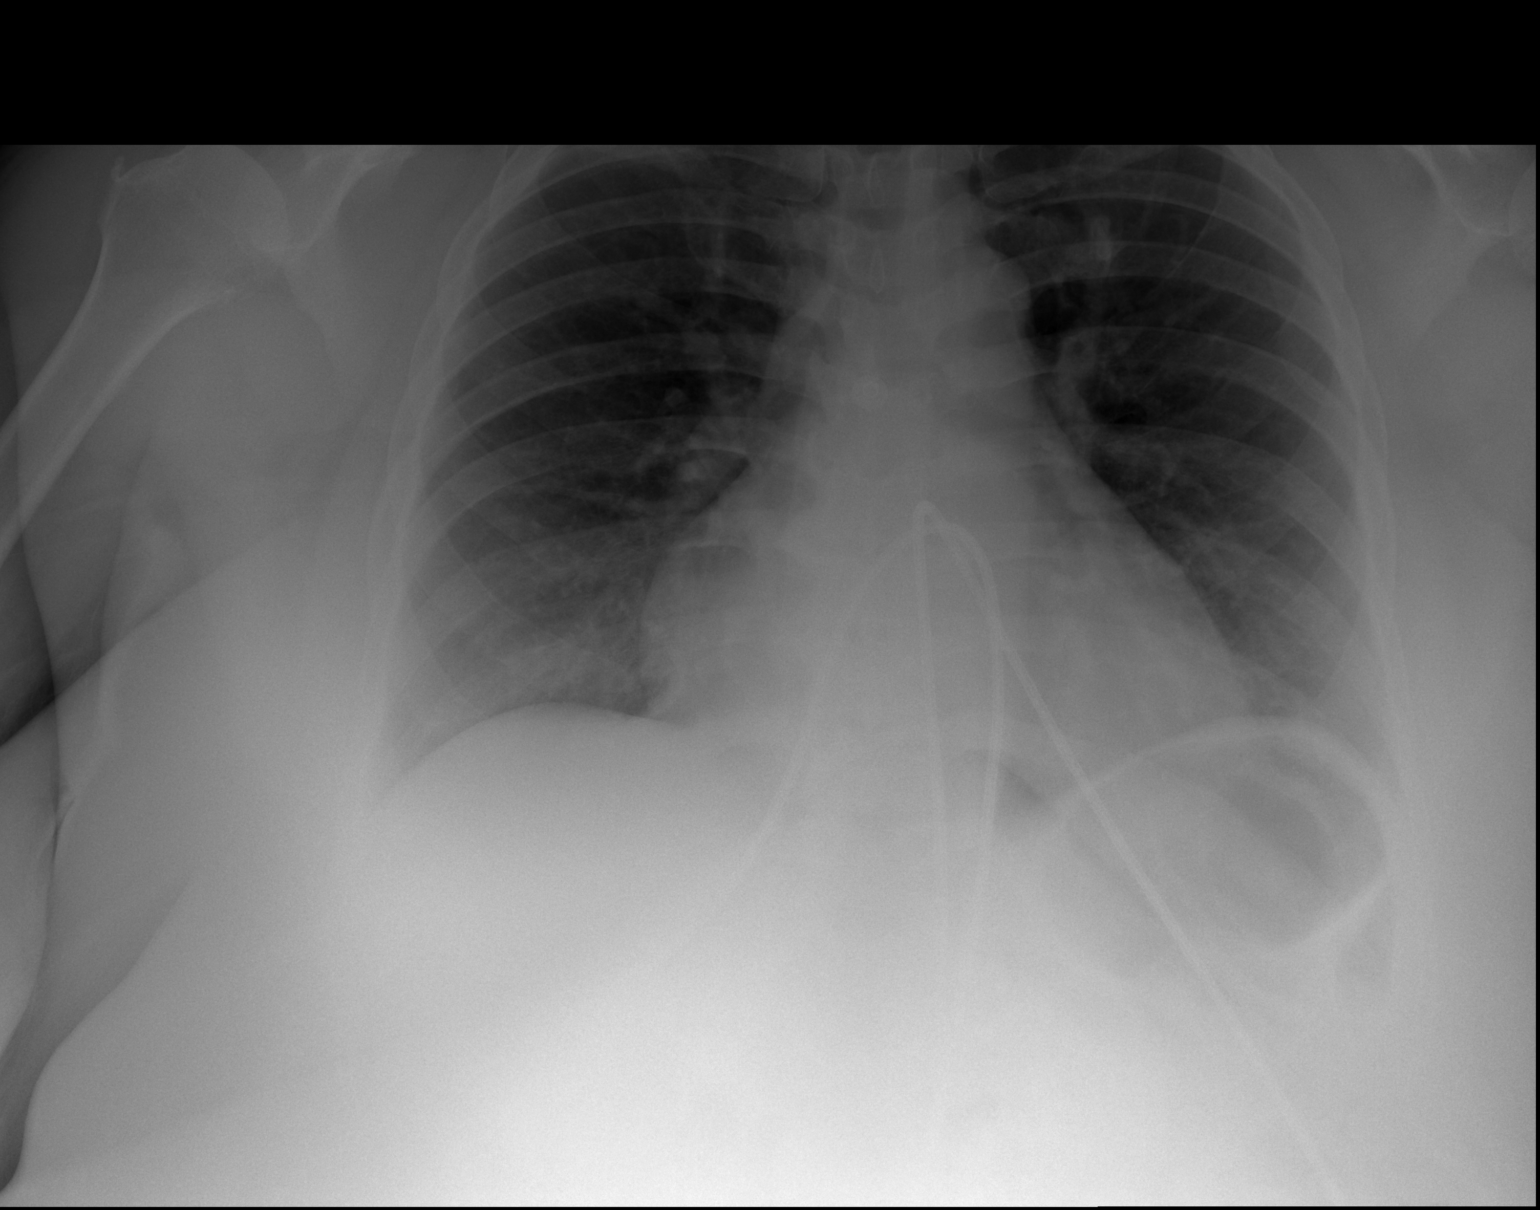
[im 2/2]
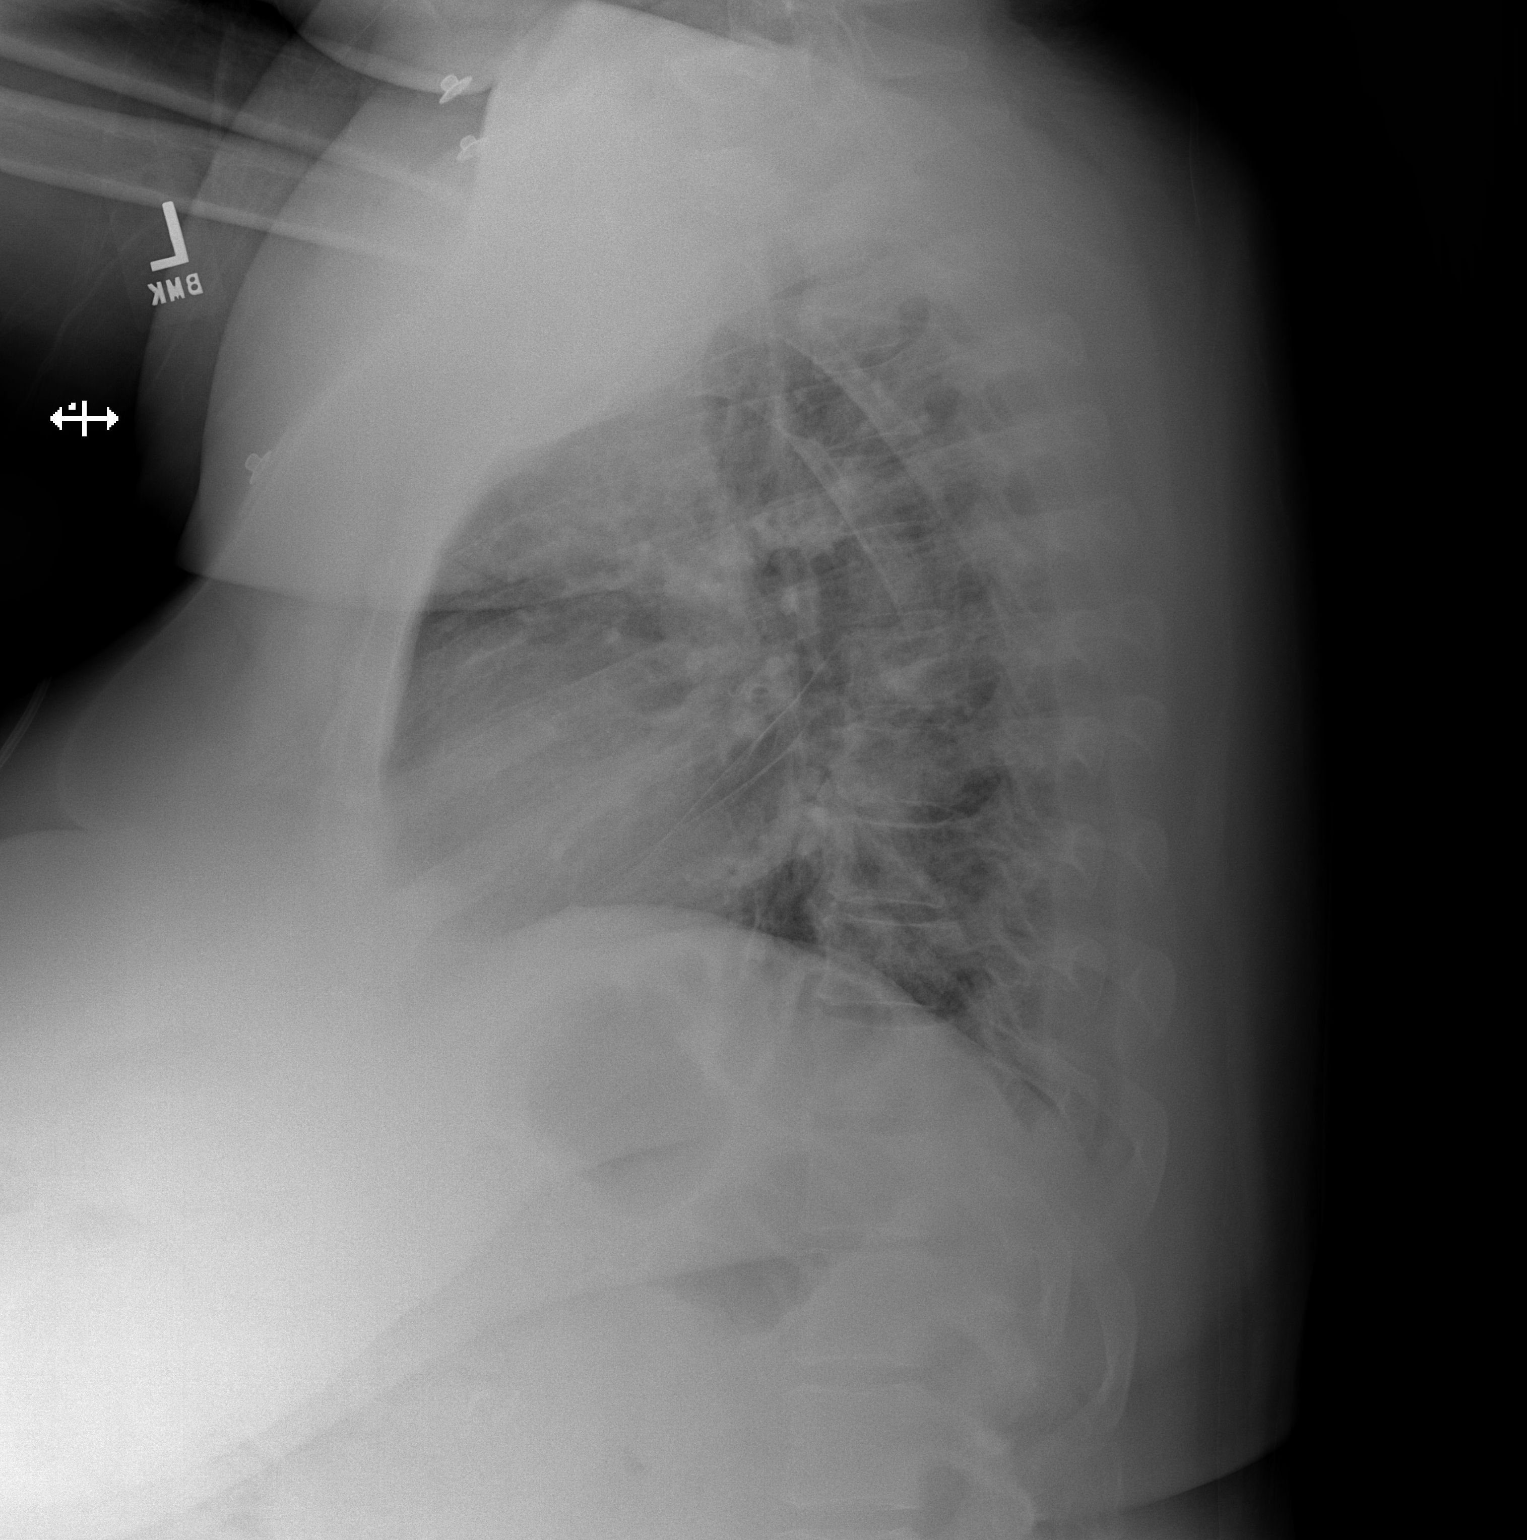

[2 of 2 positions shown; findings below may reference images not displayed]

PROCEDURE:     DXR - DXR CHEST PA (OR AP) AND LATERAL  - October 20, 2012  [DATE]

RESULT:

Comparison is made to a prior study dated 09/14/2012.

The patient has taken a shallow inspiration. With technique taken into
consideration, there is no evidence of focal infiltrates, effusions or
edema. The cardiac silhouette is within the upper limits of normal. The
visualized bony skeleton is unremarkable.
IMPRESSION: Shallow inspiration which limits evaluation. There does not
appear to be evidence of acute cardiopulmonary disease. Repeat evaluation is
recommended with deeper inspiration.

## 2014-07-11 NOTE — Discharge Summary (Signed)
PATIENT NAME:  [name], [name] MR#:  [id number] DATE OF BIRTH:  30-Mar-1965  DATE OF ADMISSION:  07/11/2012 DATE OF DISCHARGE:  07/12/2012  ADMISSION DIAGNOSES:  1.  Edema.  2.  Acute renal failure.   DISCHARGE DIAGNOSES: 1.  Swelling of her face, lips and hands of unclear etiology.  2.  Acute renal failure, possibly from hypovolemia and non-steroidal antiinflammatory drug use. 3.  Hypotension, resolved, secondary to hypovolemia. 4.  Diabetes.  5.  Chronic pain syndrome.  6.  Status post gastric bypass.   CONSULTANTS: Dr. Murlean Iba.   LABORATORY AND DIAGNOSTICS:  Discharge white blood cells 8.6, hemoglobin 11.5, hematocrit 35, and platelets 388.  Sodium 137, potassium 5.5, chloride 108, bicarbonate 23, BUN 23, creatinine 1.32, and glucose 184. Hemoglobin A1c 6.6. LDL 122.   TSH 2.26.   2-D echocardiogram showed an EF of 55% to 60% with impaired relaxation of LV diastolic function.   HOSPITAL COURSE:  This is a 49 year old female who was complaining of swelling in her face, lips and hands, and also noticed to have acute renal failure. For further details, please refer to the H and P.  1.  Acute renal failure though to be secondary to NSAID use or hypovolemia. The patient's creatinine actually much improved with addition of IV fluids and holding any nephrotoxic agents. Her echo was pretty much unremarkable with some diastolic dysfunction. Her EF was normal. TSH was normal. Urinalysis showed no evidence of any nephritic or nephrotic syndrome. We appreciate renal consult. Her renal ultrasound also did not show any evidence of hydronephrosis.  2.  Edema of her face, lips and hands.  This seems to be kind of moving, according to the patient. She had very minimal edema on the right side of her face at discharge.  It is unclear what this is secondary to. Could be possible allergic reaction. They said it coincided with some bug bites that she had about a week ago.  We tried trial of steroids, stop  NSAIDs and referral to allergist.  3.  Hypotension. Resolved, likely from hypovolemia.  4.  Diabetes. The patient will resume her outpatient medications. 5.  Chronic pain syndrome. She missed her clinic appointment. She has been on p.o. Dilaudid in the past, which we will continue.  We advised no NSAID use due to problem #2 which could have exacerbated her symptoms, as well as problem #1.  6.  Hyperkalemia which was treated with Kayexalate prior to discharge.   DISCHARGE MEDICATIONS: 1.  Topiramate 50 mg b.i.d.  2.  Trazodone 150 mg daily.  3.  Lamotrigine 150 mg daily.  4.  Atorvastatin 20 mg at bedtime.  5.  Vitamin B12 3000 mg at bedtime.  6.  Multivitamin 1 tablet daily.  7.  NovoLog Flex Pen 32 units t.i.d.  8.  Clindamycin 150 mg 4 tablets prior to dental appointments.  9.  Tizanidine 4 mg 2 tablets every 6 hours p.r.n. muscle spasm.  10.  Gabapentin 800 mg 4 times a day.  11.  Seroquel XR 400 mg 2 tablets at bedtime.  12.  Lantus 50 units at bedtime. 13.  Zoloft 50 mg daily.  14.  Omeprazole 40 mg daily.  15.  Prednisone taper starting at 50 mg taper by 10 mg every day for allergic reaction.  16.  Dilaudid 4 mg q. 8 hours p.r.n. pain, #15.  17.  Metoprolol 25 mg 2 tablets daily.  18.  Diphenhydramine 25 mg q. 8 hours p.r.n. itching.   DISCHARGE  DIET: Low sodium, ADA diet.   DISCHARGE ACTIVITY: As tolerated.       DISCHARGE FOLLOWUP: The patient can follow up with [facility] in 1 to 2 weeks.  TIME SPENT ON DISCHARGE:  Approximately 35 minutes.  ____________________________ Donell Beers. Benjie Karvonen, MD spm:sb D: 07/12/2012 13:55:31 ET T: 07/12/2012 14:07:05 ET JOB#: [id number]  cc: Bond Grieshop P. Benjie Karvonen, MD, <Dictator> Rexanne Mano, MD Donell Beers Kirubel Aja MD ELECTRONICALLY SIGNED 07/12/2012 14:47

## 2014-07-11 NOTE — Discharge Summary (Signed)
PATIENT NAME:  [name], [name] MR#:  [id number] DATE OF BIRTH:  [birth year]  DATE OF ADMISSION:  08/04/2012 DATE OF DISCHARGE:  08/04/2012  ADMISSION DIAGNOSIS:  Nausea and vomiting.   DISCHARGE DIAGNOSES: 1.  Nausea and vomiting, likely viral in nature.  2.  Diabetes.  3.  Chronic pain syndrome.  4.  Morbid obesity.   CONSULTATIONS:  None.   LABORATORY DATA:  A CT of the abdomen and pelvis showed no acute process.  No ileus or bowel obstruction.   HOSPITAL COURSE:  A 49 year old female who presented with nausea and vomiting.  For further details, please refer to Dr. Ward Givens H and P.  1.  Nausea and vomiting, likely secondary to viral etiology as patient's nausea and vomiting has subsided.  CT showed no acute abdominal pathology.  She says her diabetes is controlled so unlikely this gastroparesis.  She tolerated her diet.  2.  Diabetes.  The patient will continue her outpatient medications.  3.  Chronic pain syndrome.  The patient does see a pain specialist and she was continued on her outpatient regimen.  4.  Morbid obesity.  We encouraged weight loss as tolerated.   DISCHARGE MEDICATIONS: 1.  Trazodone 150 mg at bedtime.  2.  NovoLog 32 units 3 times a day with meals.  3.  Tizanidine 4 mg 2 tablets q. 6 to 8 hours as needed muscle spasm.  4.  Seroquel XR 400 mg 2 tablets at bedtime.  5.  Gabapentin 800 mg 4 times a day.  6.  Lamictal 100 mg daily.  7.  Omeprazole 20 mg twice daily.  8.  Vitamin B12 1000 mcg daily.  9.  Atorvastatin 80 mg daily.  10.  Zoloft 100 mg daily.  11.  Etodolac 500 mg daily.  12.  Lasix 20 mg daily.  13.  Tramadol 50 mg q. 6 hours as needed.  14.  Phenergan 25 mg q. 4 hours as needed.  15.  Hydromorphone 4 mg q. 8 hours as needed pain.  16.  Lantus 20 units at bedtime.   DISCHARGE DIET:  ADA diet.   DISCHARGE ACTIVITY:  As tolerated.   DISCHARGE FOLLOW-UP:  With Dr. Rexanne Mano in 1 to 2 weeks.     TIME SPENT:  Approximately 35 minutes.     ____________________________ Donell Beers. Benjie Karvonen, MD spm:ea D: 08/04/2012 12:01:14 ET T: 08/05/2012 07:14:39 ET JOB#: [id number]  cc: Kaicee Scarpino P. Benjie Karvonen, MD, <Dictator> Dr. Rexanne Mano Kristin Lamagna P Toshika Parrow MD ELECTRONICALLY SIGNED 08/06/2012 13:42

## 2014-07-11 NOTE — Discharge Summary (Signed)
PATIENT NAME:  [name], [name] MR#:  [id number] DATE OF BIRTH:  10-Oct-1965  DATE OF ADMISSION:  03/16/2013 DATE OF DISCHARGE:  03/18/2013  ADMISSION DIAGNOSIS:  Metabolic encephalopathy secondary to hypoglycemia.   DISCHARGE DIAGNOSES: 1.  Metabolic encephalopathy secondary to hypoglycemia, resolved.  2.  Hypoglycemia secondary to weight loss, still taking Januvia.  3.  Diabetes.  4.  Schizophrenia.   CONSULTATIONS: None.   LABORATORIES AT DISCHARGE: Blood sugars range between 115 to 190s. White blood cells 6.7, hemoglobin 10, hematocrit 31, platelets of 468. Sodium 140, potassium 3.9, chloride 106, bicarb 27, BUN 14, creatinine 1.12. Glucose is 118.   HOSPITAL COURSE: This is a 49 year old female who presented with metabolic encephalopathy and hypoglycemia. For further details, please refer to the H and P.  1.  Metabolic encephalopathy thought to be secondary to hypoglycemia. As her blood sugar actually improved, her altered mental status also improved as well.  2.  Hypoglycemia. The patient has had significant weight loss after her bariatric surgery. I suspect that her hypoglycemia is secondary taking Januvia. She was taken off her insulin but she continues to have these episodes where she passes out due to hypoglycemic events. At this time, I do not think she needs Januvia. She has a followup with Dr. Gabriel Carina next week, January 8th, and her and Dr. Gabriel Carina can decide what medications are best for this patient. She will continue on her  ADA diet.  3.  Schizophrenia.  The patient should continue outpatient medications.  4.  Anxiety/ depression. The patient will continue on her Klonopin.   DISCHARGE MEDICATIONS:  1.  Atorvastatin 80 mg daily.  2.  Vitamin D3 1000 international units 2 tablets daily.  3.  Lasix 20 mg daily.  4.  Gabapentin 800 mg 4 times a day.  5.  Promethazine 25 mg q.4 hours p.r.n.  6.  Tizanidine 4 mg 1 to 2 tablets q.6 to 8 hours p.r.n. pain.  7.  Tramadol 50 mg q.6 hours  p.r.n. pain.  8.  Zoloft 100 mg 2 tablets daily.  9.  Clonazepam 0.5 mg at bedtime.  10.  Seroquel 400 mg 2 tablets at bedtime.  11.  Etodolac extended-release 500 mg daily.  12.  Percocet 7.5/325 one tablet q.6 hours p.r.n.  13.  Dilaudid 4 mg q.6 hours p.r.n.  14.  Aspirin 81 mg daily.   DISCHARGE DIET: ADA diet.   DISCHARGE ACTIVITY: As tolerated.   DISCHARGE FOLLOWUP: The patient has a followup on January 8th with Dr. Gabriel Carina as per her.   TIME SPENT: Approximately 35 minutes.   The patient is clinically stable for discharge.     ____________________________ Kla Bily P. Benjie Karvonen, MD spm:cs D: 03/18/2013 13:03:28 ET T: 03/18/2013 14:05:36 ET JOB#: [id number]  cc: Verle Brillhart P. Benjie Karvonen, MD, <Dictator> A. Lavone Orn, MD Ulice Bold P Erika Slaby MD ELECTRONICALLY SIGNED 03/18/2013 15:02

## 2014-07-11 NOTE — H&P (Signed)
PATIENT NAME:  [name], [name] MR#:  [id number] DATE OF BIRTH:  [birth year]  DATE OF ADMISSION:  03/16/2013  CONTINUATION  MEDICATIONS: Vitamin D3, 1000 mg 2 tablets once daily, Valium 5 mg 4 times a day, tramadol 50 mg every 6 hours, tizanidine 4 mg 1 to 2 every 6 to 8 hours, sertraline 100 mg 2 tablets once a day, Seroquel 400 mg XR once at night, 2 tablets, promethazine 25 mg every 4 hours, Percocet as needed for pain, Januvia 25 mg once a day, gabapentin 800 mg 4 times a day, furosemide 20 mg once a day, etodolac extended release once a day, Dilaudid 4 mg every 6 hours, clonazepam 0.5 mg once a day, atorvastatin 80 mg once a day.   PHYSICAL EXAMINATION:  VITAL SIGNS: Blood pressure 136/78, pulse 99, respiratory rate 20, temperature 98.4, oxygen saturation 96% on room air.  GENERAL: The patient is alert, oriented x 3. No acute distress. No respiratory distress. Hemodynamically stable.  HEENT: Pupils are equal and reactive. Extraocular movements are intact. Mucosa are moist. Anicteric sclerae. Pink conjunctivae. No oral lesions. No oropharyngeal exudates. There is an abrasion at the level of the right cheek and nose. There is some ecchymosis at the level of the right conjunctivae, with some conjunctival edema.  NECK: Supple. No JVD. No thyromegaly. No adenopathy. No carotid bruits.  CARDIOVASCULAR: Regular rate and rhythm. No murmurs, rubs or gallops are appreciated.  LUNGS: Clear, without any wheezing or crepitus. No use of accessory muscles.  ABDOMEN: Soft, nontender, nondistended. No hepatosplenomegaly. No masses. Bowel sounds are positive.  EXTREMITIES: No edema, cyanosis or clubbing. Pulses +2. Capillary refill less than 3.  NEUROLOGIC: Cranial nerves II to XII intact. Strength is 5/5 upper extremities. No focal findings.  PSYCHIATRIC: No significant agitation or hallucinations.  MUSCULOSKELETAL: No joint deformity. Tenderness to palpation at the level of both shoulders, shoulder blades and  clavicles, which is chronic.  LYMPHATIC: Negative for lymphadenopathy in neck supraclavicular areas.   RESULTS:  Glucose was 50, creatinine 1.28, sodium 138, potassium 3.8. Glucose now is 52. Dilantin 3.7. Opiates positive, tricyclics positive. White count is 7.4, hemoglobin 10, platelets  537. Urinalysis negative for urinary tract infection.   ASSESSMENT AND PLAN: This is a 49 year old female admitted with a fall that happened after a hypoglycemic episode. She has been admitted here for syncope in the past.   1. Altered mental status, now resolved after correction of hypoglycemia due to metabolic encephalopathy.   2. Hypoglycemia secondary to patient not eating. The patient is admitted. She is on D5W for now. She is going to be started on a diabetic diet. Her Accu-Cheks are going to be checked every 4 hours. Her Januvia is going to be held, but we are going to put her on insulin sliding scale and only treat blood sugars above 200.   3. The patient seems to be stable right now. She is asking for pain medication due to her chronic issues. We are going to avoid heavy narcotics, especially Dilaudid IV. The patient can take her own p.o. medications.   4.  Diabetes. Continue to monitor. Hold Januvia.   5. Hypertension. Seems to be stable. Continue treatment with furosemide. This is the only blood pressure medication that patient takes.   6.  Anxiety and depression. Continue Valium.   7.  Schizophrenia. Continue Seroquel.   8.  Other medical problems seem to be stable. No evidence of new fractures ( The patient has anemia of chronic disease, which is stable  as well.   I spent about 45 minutes with this patient, admitted for observation.    ____________________________ [place] Sink, MD rsg:mr D: 03/16/2013 19:23:05 ET T: 03/16/2013 19:48:15 ET JOB#: [id number]  cc: [place] Sink, MD, <Dictator> Anndee Connett America Brown MD ELECTRONICALLY SIGNED 03/18/2013 0:55

## 2014-07-11 NOTE — Discharge Summary (Signed)
PATIENT NAME:  [name], [name] MR#:  [id number] DATE OF BIRTH:  1965/05/03  DATE OF ADMISSION:  03/02/2013 DATE OF DISCHARGE:  03/05/2013  DISCHARGE DIAGNOSES: 1.  Syncopal episode, likely vasovagal.  No antiepileptics recommended at this time by neurology.  2.  Bilateral shoulder fracture, will require outpatient evaluation by orthopedics.  3.  Rhabdomyolysis, resolved with hydration.  4.  Right greater tuberosity fracture, nondisplaced, will require six weeks of immobilization by orthopedics.   SECONDARY DIAGNOSES: 1.  Diabetes.  2.  Hypertension.  3.  Chronic low back pain.  4.  Morbid obesity.  5.  Depression.   6.  Anxiety. 7.  Schizophrenia.  8.  Bipolar disorder.  9.  Osteoarthritis.   CONSULTATION:  1.  Cardiology, Dr. Saralyn Pilar.  2.  Orthopedic, Dr. Hessie Knows.  3.  Physical therapy.  4.  Neurology, Dr. Arna Medici.   PROCEDURES AND RADIOLOGY:  1.  EEG on 16th of December showed mild diffuse slowing.  2.  Chest x-ray on 19th of December showed no active cardiopulmonary disease.  3.  Left shoulder x-ray on 12th of December showed comminuted fracture of the greater tuberosity of the humerus.  Suspect comminuted fracture of the glenoid.  4.  Right shoulder x-ray on 12th of December showed posterior humeral head fracture.  5.  Bilateral carotid Doppler on 12th of December showed no hemodynamically significant stenosis.  6.  CT scan of the head without contrast on 12th of December showed no acute intracranial process.  7.  CT angio of the chest on 12th of December showed no pulmonary embolism.  Atelectasis.  Bilateral trace pleural fluid.  8.  CT scan of the right shoulder showed comminuted, nondisplaced greater tuberosity fracture.  Fracture extending through the rotator cuff footprint.  Inferior glenoid fracture with fracture fragment larger than the contralateral side measuring 18 x 10 x 18 mm, probable disruption of the inferior glenohumeral ligament and middle  glenohumeral ligament.   9.  Left shoulder CT scan on 12th of December showed mildly displaced left greater tuberosity fracture involving the rotator cuff footprint.  Comminuted inferior glenoid fracture with larger fragment likely associated with glenoid avulsion of the glenohumeral ligaments.  Small intra-articular fragment of the glenoid in the anterior/inferior quadrant.  10.  A 2-D echo on 12th of December showed LVEF of 50% to 55%.  Impaired relaxation of LV diastolic filling.  Mild mitral and tricuspid valve regurgitation.  Moderately increased LV posterior wall thickness.   MAJOR LABORATORY PANEL:  UA on admission was negative.   HISTORY AND SHORT HOSPITAL COURSE:  The patient is a 49 year old female with the above-mentioned medical problems who was admitted for syncope which was thought to be vasovagal in etiology.  She also was found to have shoulder fracture.  Please see Dr. Rinaldo Ratel dictated history and physical for further details.  Cardiology consultation was obtained with Dr. Saralyn Pilar considering syncope.  He recommended 2-D echo for further evaluation which was performed with results dictated above.  The orthopedic consultation was obtained with Dr. Hessie Knows considering left shoulder fracture and right shoulder pain.  He recommended CT scan of both the right and left shoulder for further evaluation which was performed with results dictated above.  After evaluation by CT scan he recommended putting a sling on the left which will take about six weeks total to heal.  On the right side the patient had tuberosity fracture which was nondisplaced and will take about six weeks of immobilization.  Considering her body habitus  bilateral slings were not recommended as she will not tolerate two slings per orthopedic and Dr. Rudene Christians recommended sling on the left, instructed to immobilize the right and follow up with him as an outpatient.   Neurological consultation was obtained with Dr. Arna Medici who  recommended EEG to evaluate for possible seizure, although this was felt to be less likely and no antiseizure medication was recommended.  EEG was negative for any seizure focus.  The patient was feeling much better and was close to her baseline on 16th of December except her shoulder pain which was due to her fractures.  She was ambulating without significant difficulty and was discharged home in stable condition.   PHYSICAL EXAMINATION: VITAL SIGNS:  On the date of discharge, her vital signs were as follows:  Temperature 98.5, heart rate 83 per minute, respirations 17 per minute, blood pressure 113/77 mmHg.  She was saturating 95% on room air.  Pertinent physical examination on the date of discharge:  CARDIOVASCULAR:  S1, S2 normal.  No murmur, rubs or gallop.  LUNGS:  Clear to auscultation bilaterally.  No wheezing, rales, rales, rhonchi or crepitation.  ABDOMEN:  Soft, benign.  NEUROLOGIC:  Nonfocal examination.  Her left arm is in a sling.  Right arm is recommended to be immobilized due to fracture there also.  All other physical examination remained at the baseline.   DISCHARGE MEDICATIONS: 1.  Atorvastatin 80 mg by mouth at bedtime.  2.  Vitamin D3 1000 international units two tablets by mouth daily.  3.  Lasix 20 mg by mouth daily.  4.  Gabapentin 800 mg by mouth 4 times a day.  5.  Promethazine 25 mg by mouth every four hours as needed.  6.  Tizanidine 4 mg 1 to 2 tablets every 6 to 8 hours as needed.  7.  Tramadol 50 mg by mouth every six hours as needed.  8.  Januvia 25 mg by mouth daily.  9.  Sertraline 100 mg two tablets by mouth daily.  10.  Clonazepam 0.5 mg by mouth at bedtime.  11.  Seroquel XR 400 mg two tablets by mouth every evening.  12.  Ketoralac 500 mg by mouth daily.  13.  Acetaminophen/oxycodone 325/10 mg 1 tablet by mouth twice daily for eight days as needed for pain.  14.  Metoprolol 25 mg by mouth twice daily.   DISCHARGE DIET:  Low sodium 1800 ADA.    DISCHARGE ACTIVITY:  As tolerated.  She was instructed to keep sling on the left shoulder and immobilizer right shoulder for six weeks for healing.  Considering her body habitus bilateral slings were not recommended by orthopedics.   FOLLOWUP:  She will need follow-up with her primary care physician, Eulogio Bear, FNP in 2 to 4 weeks and with Dr. Hessie Knows in 1 to 2 weeks.   Total time discharging this patient was 45 minutes.    ____________________________ Lucina Mellow. Manuella Ghazi, MD vss:ea D: 03/10/2013 00:51:01 ET T: 03/10/2013 04:24:13 ET JOB#: [id number]  cc: Vandana Haman S. Manuella Ghazi, MD, <Dictator> Laurene Footman, MD Dani Gobble White, FNP Isaias Cowman, MD Cristopher Estimable. Mar Daring, [place] MD ELECTRONICALLY SIGNED 03/11/2013 7:51

## 2014-07-11 NOTE — Consult Note (Signed)
PATIENT NAME:  NIKKIE, LIMING MR#:  [id number] DATE OF BIRTH:  September 04, 1965  DATE OF CONSULTATION:  03/01/2013  CONSULTING PHYSICIAN:  Isaias Cowman, MD  PRIMARY CARE PHYSICIAN:  Dani Gobble. White, FNP  CHIEF COMPLAINT:  "I passed out."   HISTORY OF PRESENT ILLNESS: The patient is a 49 year old female with history of hypertension, diabetes, and schizophrenia. The patient presented to [facility] Emergency Room following a syncopal episode. The patient was in her usual state of health until 02/28/2013 when she was found by her husband on the bathroom floor. The patient reports that she does not remember falling or losing consciousness. She had no pre-event symptoms of nausea, chest pain, or shortness of breath or dizziness. In the Emergency Room, head CT was performed, which was negative. Chest CT was performed because of elevated D-dimer, which was negative for pulmonary embolus. The patient was complaining of left shoulder pain, and x-ray revealed left shoulder fracture. The patient has been seen by Dr. Rudene Christians. The patient has complained of chest pain on and off, which is chronic in nature. Troponin is less than 0.2. EKG does not reveal any acute ischemic ST-T wave changes. Telemetry has revealed predominant sinus rhythm without evidence for brady- or tachyarrhythmia.   PAST MEDICAL HISTORY: 1.  Diabetes.  2.  Hypertension.  3.  Morbid obesity with history of gastric bypass surgery 04/2012. 4.  Schizophrenia.  5.  Bipolar disorder. 6.  Chronic low back pain. 7.  Prior history of syncope in the setting of hypoglycemia.   MEDICATIONS: Furosemide 20 mg daily, atorvastatin 80 mg daily, vitamin D3 two tablets daily, tramadol 50 mg q.6, sertraline 200 mg daily, Seroquel 800 mg daily, promethazine 25 mg q.4 hours, lorazepam 0.5 mg daily, Januvia 25 mg daily, Zoloft 200 mg daily.   SOCIAL HISTORY: The patient is married and lives with her husband and son. She is disabled. She denies prior tobacco or EtOH  use.   FAMILY HISTORY: No immediate family history for coronary artery disease or myocardial infarction.   REVIEW OF SYSTEMS:    CONSTITUTIONAL: No fever or chills.  EYES: No blurry vision.  EARS: No hearing loss.  RESPIRATORY: No shortness of breath.  CARDIOVASCULAR: Chest pain as described above.  GASTROINTESTINAL: The patient has intermittent nausea, takes Phenergan following gastric bypass surgery.  GENITOURINARY: No dysuria or hematuria.  ENDOCRINE: No polyuria or polydipsia.  MUSCULOSKELETAL: No arthralgias or myalgias  NEUROLOGICAL: No focal muscle weakness or numbness.  PSYCHOLOGICAL: The patient has a history of schizophrenia and bipolar disorder.   PHYSICAL EXAMINATION: VITAL SIGNS: Blood pressure 114/75, pulse 107, respirations 18, temperature 98.6, pulse oximetry 95%.  HEENT: Pupils equal, reactive to light and accommodation.  NECK: Supple without thyromegaly.  LUNGS: Clear.  CARDIOVASCULAR: Normal JVP. Normal PMI. Regular rate and rhythm. Normal S1, S2. No appreciable gallop, murmur, or rub.  ABDOMEN: Soft and nontender.  EXTREMITIES: Pulses were intact bilaterally.  MUSCULOSKELETAL: Normal muscle tone.  NEUROLOGIC: The patient is alert and oriented x 3. Motor and sensory both grossly intact.   ACCESSORY DATA: EKG was performed, which revealed low-normal left ventricular function with LVEF of 50% to 55%.   IMPRESSION: This is a 49 year old female who presents after a syncopal episode with left shoulder fracture with negative troponin, normal ECG without evidence for tachyarrhythmia or bradyarrhythmia on telemetry. The patient has known normal left ventricular function. Based on her history, the patient likely experienced a vasovagal episode. The patient has chest pain with atypical features.   RECOMMENDATIONS: 1.  Agree with overall current therapy.  2.  Would defer full-dose anticoagulation.  3.  Will continue to monitor on telemetry.  4.  Would defer further cardiac  diagnostics at this time.   ____________________________ Isaias Cowman, MD ap:jcm D: 03/01/2013 16:54:47 ET T: 03/01/2013 17:44:29 ET JOB#: [id number]  cc: Isaias Cowman, MD, <Dictator> Isaias Cowman MD ELECTRONICALLY SIGNED 03/08/2013 8:52

## 2014-07-11 NOTE — Discharge Summary (Signed)
PATIENT NAME:  [name], [name] MR#:  [id number] DATE OF BIRTH:  [birth year]  DATE OF ADMISSION:  10/10/2012  DATE OF DISCHARGE:  10/12/2012  ADMITTING DIAGNOSIS:  Hypoglycemia.   DISCHARGE DIAGNOSES: 1.  Hypoglycemia, possibly related to patient losing weight as a result of her bypass. The patient was on high-dose Lantus, now her Lantus is significantly reduced.  2.  Diabetes type 2, on insulin.  3.  Hypertension.  4.  Morbid obesity, status post bariatric surgery.  5.  Chronic back pain.  6.  Depression.  7.  Anxiety.  8.  Osteoarthritis.  9.  Status post knee replacement.  10.  Status post cholecystectomy.  11.  Status post partial hysterectomy. 12. Status post knee replacement.   PERTINENT LABS AND EVALUATIONS: Urine cultures showed multiple bacterial organisms suggestive of contamination. UA showed epithelial cells 3+. Glucose on admission 86, BUN 13, creatinine 1.17, sodium 139, potassium 3.2, chloride 105, CO2 was 29, calcium 8.6. LFTs showed AST of 39. WBC 7.1, hemoglobin 11.3, platelet count 348. EKG:  Normal sinus rhythm with prolonged QT.   HOSPITAL COURSE: Please refer to H and P done by the admitting physician. The patient is a 49 year old African-American female with a history of diabetes on insulin, who has a history of chronic back pain and morbid obesity, who was noted to be lethargic, and started to have jerking of the upper extremities. The patient checked her sugar, and it was 15. The patient was brought to the ED, and her sugars continued to remain very low. Therefore, she was admitted to the hospital. She was continued on D5 for the next 12 hours, then subsequently, her D5 was discontinued. Her sugars have been continuing to be labile, mostly elevated in the 190s to 150s. At this time, her insulin is restarted, but at a lower rate. She is feeling much better and is stable for discharge. Will get her to be seen as an outpatient by Endocrinology.   DISCHARGE MEDICATIONS:    1.  Aspirin 81, 1 tab p.o. daily.  2.  Atorvastatin 80 at bedtime.  3.  Vitamin D3, 1000 international units 2 tabs daily. 4.  Etodolac 500 mg daily. 5.  Lasix 20 daily. 6.  Gabapentin 800, 1 tab 4 times a day. 7. The patient to continue sliding scale as before.  8.  Lamotrigine 100 daily. 9.  Promethazine 25 q. 4 p.r.n.  10.  Sertraline 50 daily. 11.  Topamax 50, 1 tab p.o. b.i.d.  12.  Tizanidine 4 mg 1 to 2 tabs q. 4-6 p.r.n.  13.  Tramadol 50, 1 tab p.o. q. 6 p.r.n.  14.  Ativan 0.5, 1 tab p.o. t.i.d.  15.  Lantus 5 units at bedtime.   DIET:  Low sodium, low fat, low cholesterol, carbohydrate diet.   ACTIVITY:  As tolerated.   Followup with primary MD in 1 to 2 weeks. Follow with Dr. Gabriel Carina of Endocrinology in 2 to 4 weeks.  The patient to keep a log of blood glucose 3 times a day prior to each meal.    Note:  35 minutes spent on this patient.    ____________________________ [name]. Posey Pronto, MD shp:mr D: 10/12/2012 14:39:59 ET T: 10/12/2012 22:24:29 ET JOB#: [id number]  cc: Arnol Mcgibbon H. Posey Pronto, MD, <Dictator> Alric Seton MD ELECTRONICALLY SIGNED 10/22/2012 8:56

## 2014-07-11 NOTE — H&P (Signed)
PATIENT NAME:  [name], [name] MR#:  [id number] DATE OF BIRTH:  [birth year]  DATE OF ADMISSION:  10/10/2012  PRIMARY CARE PHYSICIAN: Dr. Eulogio Bear.   REFERRING PHYSICIAN: Marjean Donna.   CHIEF COMPLAINT: Hypoglycemia.   HISTORY OF PRESENT ILLNESS: [name] is 49 year old African American female with a history of diabetes mellitus type 2 on insulin using Lantus, history of chronic back pain and morbid obesity. She was in her usual state of health until last evening when her husband noticed that she was more lethargic, and she started to have jerking of her upper extremities and shaking. Therefore, he checked her blood sugar, and she states that her blood sugar was low at 15. He called EMS who came to the house, and they documented that her sugar was very low at 15. She received dextrose intravenous injection and was transported here. Upon arrival, her blood sugar was 86, then went to 158, then after 2 hours, it went down to 20. The patient was placed on IV dextrose and admitted for observation to follow up on her blood sugar. Other than that, the patient has no other symptoms. She has only her chronic pain issue for which she tells me that her primary care physician has stopped the Dilaudid 3 months ago and she is now on tramadol. Nevertheless, she was asking me if I could give her intravenous Dilaudid while she is here. I refused to do so as there is no indication.   REVIEW OF SYSTEMS:  CONSTITUTIONAL: Reported some headache earlier when her sugar was low. She has chronic pains in her back and joints. Denies any fever. No chills. She has mild fatigue.  EYES: No blurring of vision. No double vision.  ENT: No hearing impairment. No sore throat. No dysphagia.  CARDIOVASCULAR: No chest pain. No shortness of breath. No syncope.  RESPIRATORY: No shortness of breath. No chest pain. No cough. No hemoptysis.  GASTROINTESTINAL: No abdominal pain. No vomiting. No diarrhea.  GENITOURINARY: No dysuria.  No frequency of urination.  MUSCULOSKELETAL: She has chronic pains in her back and joints. No joint swelling. No muscular pain or swelling.  INTEGUMENTARY: No skin rash. No ulcers.  NEUROLOGY: No focal weakness. No seizure activity. No headache now, but earlier she had headache when her sugar was low.  PSYCHIATRY: She has anxiety and depression but not active problem right now.  ENDOCRINE: No polyuria or polydipsia. No heat or cold intolerance.   PAST MEDICAL HISTORY:,: Diabetes mellitus type 2 on insulin, systemic hypertension, morbid obesity, status post bypass bariatric surgery, chronic back pain, depression, anxiety, osteoarthritis, status post knee replacement.   PAST SURGICAL HISTORY: Cholecystectomy, partial hysterectomy and knee replacement.   SOCIAL HABITS: Nonsmoker. No history of alcohol or drug abuse.   FAMILY HISTORY: Her mother died from complications of diabetes mellitus. Her father had stroke. She has a sister who has diabetes.   SOCIAL HISTORY: She is married, living with her husband. She lives on disability based on her chronic back pain.   ADMISSION MEDICATIONS: Lantus 20 units once at night, lamotrigine 100 mg once a day, gabapentin 800 mg 4 times a day, furosemide 20 mg once a day, etodolac 500 mg once a day, atorvastatin 80 mg once a day, Ativan 0.5 mg 3 times a day p.r.n., aspirin 81 mg a day, tramadol 50 mg q.6 hours p.r.n., vitamin D3 1000 units 2 tablets once a day, topiramate 50 mg twice a day, tizanidine 4 mg taking 1 to 2 tablets q.6 hours p.r.n. and  Zoloft 50 mg once a day.   ALLERGIES: PENICILLIN CAUSING ANAPHYLAXIS AND MORPHINE CAUSING SKIN RASH.   PHYSICAL EXAMINATION:  VITAL SIGNS: Blood pressure 128/67, respiratory rate 18, pulse 77, temperature 97.5, oxygen saturation 99%.  GENERAL APPEARANCE: Young female lying in bed in no acute distress.  HEAD AND NECK: No pallor. No icterus. No cyanosis. Ear examination revealed normal hearing, no discharge, no lesions.  Examination of the nose showed no discharge, no bleeding, no ulcers. Oropharyngeal examination revealed normal lips and tongue, no ulcers, no oral thrush. Eye examination revealed normal eyelids and conjunctivae. Pupils about 4 mm, round, equal and reactive to light. Neck is supple. Trachea at midline. No thyromegaly. No cervical lymphadenopathy. No masses.  HEART: Normal S1, S2. No S3, S4. No murmur. No gallop. No carotid bruits.  RESPIRATORY: Normal breathing pattern without use of accessory muscles. No rales. No wheezing.  ABDOMEN: Morbidly obese, soft without tenderness. No organomegaly.  SKIN: No ulcers. No subcutaneous nodules.  MUSCULOSKELETAL: No joint swelling. No clubbing.  NEUROLOGIC: Cranial nerves II through XII were intact. No focal motor deficit.  PSYCHIATRIC: The patient is alert, oriented x3. Mood and affect were normal.   LABORATORY FINDINGS: Her blood sugar series are as follows: 86, 158, 20, 162. BUN 13, creatinine 1.1, sodium 139, potassium 3.2. Normal liver function tests and transaminases. Slight elevation of AST at 39. CBC showed white count 7000, hemoglobin 11, hematocrit 34, platelet count 348. Urinalysis showed cloudy urine, +3 bacteria, 6 white blood cells.   ASSESSMENT:  1. Hypoglycemia.  2. Diabetes mellitus type 2, on insulin.  3. Hypokalemia.  4. Morbid obesity, status post bypass bariatric surgery.  5. Chronic back pain.  6. Depression.  7. Anxiety.  8. Asymptomatic bacteruria.  PLAN: Will admit for observation. Hold Lantus. Check blood sugar every 2 hours. Continue the rest of her home medications. If blood sugar remains stable, the patient can be discharged back home in the morning. Urine for culture but no treatment at this time for her bacteruria.   ____________________________ Clovis Pu. Lenore Manner, MD amd:gb D: 10/10/2012 02:50:59 ET T: 10/10/2012 03:20:20 ET JOB#: [id number]  cc: Clovis Pu. Lenore Manner, MD, <Dictator> Ellin Saba MD ELECTRONICALLY SIGNED  10/10/2012 4:59

## 2014-07-11 NOTE — Consult Note (Signed)
PATIENT NAME:  [name], [name] MR#:  [id number] DATE OF BIRTH:  29-Jul-1965  DATE OF CONSULTATION:  03/02/2013  CONSULTING PHYSICIAN:  Laurene Footman, MD  REASON FOR CONSULT: Left shoulder fracture.   HISTORY: The patient is a 49 year old who had an episode of syncope. She had severe left shoulder pain and had x-rays obtained. She additionally complained to me of her right arm, and could not raise it. An additional right shoulder x-ray was ordered, CT scan ordered of the left shoulder. With the right shoulder also showing fracture, CT of this was also ordered. She has difficulty raising the arm. She is able to flex and extend the elbows, and sensation is intact in both upper extremities. Range of motion was not tested secondary to the presence of fractures. Her CT and x-rays show on the left a comminuted fracture with glenoid fracture as well as a greater tuberosity fracture, which are minimally displaced. I did discuss with her there is a potential for a loose body in the shoulder. At the present time on her CT I think it is not a loose body at this time, but this could develop and might need arthroscopy in the future. She will need to use a sling on the left shoulder, and it will take about 6 weeks for this to heal. On the right, she additionally has a tuberosity fracture, again nondisplaced. This explains her inability to raise the arm. I discussed this with her, and this also will take about 6 weeks of immobilization. It is difficult to use bilateral slings with her body habitus, and I do not think she will tolerate 2 slings, so just a sling on the left, and instructed not to use the right arm. She will need a lot of care at home, and it will be 6 weeks, if there is no change in alignment, before she can start therapy. She will need fairly frequent x-rays. I need to see her next week, with bilateral shoulder fractures, I also have concern that she possibly could have had seizure activity to cause these  injuries, but there is no history of that. Will continue to follow her, again, at this time planning nonoperative treatment.      ____________________________ Laurene Footman, MD mjm:mr D: 03/02/2013 17:51:28 ET T: 03/02/2013 18:30:51 ET JOB#: [id number]  cc: Laurene Footman, MD, <Dictator> Laurene Footman MD ELECTRONICALLY SIGNED 03/03/2013 7:20

## 2014-07-11 NOTE — Consult Note (Signed)
Brief Consult Note: Diagnosis: left shoulder fracture, possible loose bodies, right shoulder pain.   Patient was seen by consultant.   Orders entered.   Comments: CT left shoulder ordered to assess whether fragments are loose and may need surgery. can't raise right arm.xray ordered.  Electronic Signatures: Laurene Footman (MD)  (Signed 12-Dec-14 14:58)  Authored: Brief Consult Note   Last Updated: 12-Dec-14 14:58 by Laurene Footman (MD)

## 2014-07-11 NOTE — Consult Note (Signed)
Reason for Consult: Admit Date: 01-Mar-2013  Chief Complaint: "I was watching TV and the next thing I know I'm on the floor in the bathroom crying for help"  Reason for Consult: seizure   History of Present Illness: History of Present Illness:   [name] is a 49 yo right-handed woman with a history of DM and HTN who presented to [facility] after having an apparent syncopal episode at home, complicated by bilateral shoulder fractures. The Neurology service is consulted to evaluate for the possibility of seizure as the etiology for her fall.  last remembers being in her usual state of health watching television around 1830 on 12/12. Her next memory is of waking up on the floor of the bathroom around 1930 with severe pain in both arms, left more than right. She began crying for help. Her husband, who was sleeping in the back of the house, arrived and had to pry the bathroom door open because she had apparently locked it from the inside. He found her lying on the bathroom floor crying with her underwear around her knees. She did not have any apparent incontinence of bowel or bladder in that position, and she did not have any tongue-biting or blood around her mouth. She stated to him at that time that the last thing she remembered was sitting down watching TV and she didn't know how she got to the bathroom, but otherwise the husband notes that she did not seem confused, sleepy, or have any difficulty speaking. patient and her husband report that she has had several episode in the past of loss of consciousness and confusion associated with severe hypoglycemia (in the teens). However, on presentation to [facility] on 12/12, she was found to have glucose in the 280s. Her initial workup also included noncontrast head CT, which was normal, and X-ray of the left humerus which revealed a comminuted fracture with free bone fragments. She complained of right shoulder pain as well, and CT showed a tuberosity fracture on the  right.  Seizure risk factors: Family History: father had seizures in old age related to strokes, nephew has seizuresof febrile seizures: notrauma: had mild TBI with a fall during a previous hypoglycemic episode, no LOC, no neurologic sequelaeinfection: no  ROS:  Review of Systems   As per HPI. In addition, the patient denies fevers, chills, visual changes, palpitations, cough, shortness of breath, nausea, vomiting, diarrhea, joint tenderness, or rashes. The remainder of a full 12-point review of systems is negative.   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:   Home Medications: Medication Instructions Last Modified Date/Time  acetaminophen-oxyCODONE 325 mg-10 mg oral tablet 1 tab(s) orally every 4 hours, As Needed - for Pain 12-Dec-14 11:31  atorvastatin 80 mg oral tablet 1 tab(s) orally once a day (at bedtime) 11-Dec-14 23:07  Vitamin D3 1000 intl units oral tablet 2 tab(s) (2000 units) orally once a day 11-Dec-14 23:07  furosemide 20 mg oral tablet 1 tab(s) orally once a day 11-Dec-14 23:07  gabapentin 800 mg oral tablet 1 tab(s) orally 4 times a day 11-Dec-14 23:07  promethazine 25 mg oral tablet 1 tab(s) orally every 4 hours, As Needed for nausea 11-Dec-14 23:07  tizanidine 4 mg oral tablet 1-2 tab(s) orally every 6 to 8 hours, As Needed for muscle spasms 11-Dec-14 23:07  traMADol  50 mg oral tablet 1 tab(s) orally every 6 hours, As Needed - for Pain 11-Dec-14 23:07  Januvia 25 mg oral tablet 1 tab(s) orally once a day 11-Dec-14 23:07  sertraline 100 mg oral tablet 2 tab(s) orally once a day 11-Dec-14 23:07  clonazePAM 0.5 mg oral tablet 1 tab(s) orally once a day (at bedtime) 11-Dec-14 23:07  SEROquel XR 400 mg oral tablet, extended release 2 tab(s) orally once a day (in the evening) 11-Dec-14 23:07  etodolac  extended release 500 mg oral tablet, extended release 1 tab(s) orally once a day 11-Dec-14 23:07   Allergies:  Penicillin: Anaphylaxis  Reglan: Headaches  Morphine: Rash  Social/Family History: Lives With: children; spouse  Social History: Lives at home with her husband and 43 year old son. On disability due to her right knee pain. Denies smoking, EtOH or illicit drug use.  Family History: Mother had DM and died of an MI. Father had 4 strokes and suffered from seizures after his last stroke. A nephew has seizures.   Vital Signs: **Vital Signs.:   14-Dec-14 09:35  Vital Signs Type Routine  Temperature Temperature (F) 98.1  Celsius 36.7  Temperature Source oral  Pulse Pulse 90  Respirations Respirations 18  Systolic BP Systolic BP 299  Diastolic BP (mmHg) Diastolic BP (mmHg) 93  Mean BP 104  Pulse Ox % Pulse Ox % 100  Pulse Ox Activity Level  At rest  Oxygen Delivery Room Air/ 21 %   EXAM: Well-developed, well-nourished, in NAD. No conjunctival injection or scleral edema. Oropharynx clear. No carotid bruits auscultated. Normal S1, S2 and regular cardiac rhythm on exam. Lungs have diminished breath sounds bilaterally. Abdomen soft and nontender. Peripheral pulses palpated. The right arm is held adducted and internally rotated and has limited range of motion. The left arm is immobilized in a sling.  MENTAL STATUS: Alert and oriented to person, place, and time. Language fluent and appropriate. Cognition and memory conversationally intact. CRANIAL NERVES: Visual fields full to confrontation. PERRL. EOMI. Facial sensation intact. Facial muscles full and symmetric. Hearing intact to finger rub. Uvula midline with symmetric palatal elevation. Tongue midline without fasciculations. MOTOR: Normal bulk and tone. Left arm deltoid/biceps/triceps strength not tested due to sling, but wrist flexors/extensors and hand intrinsics have full strength. Right arm deltoid strength limited by pain due to  shoulder fracture, but otherwise full strength throughout. Strength 5/5 in iliopsoas, glutes, hamstrings, quads, and tib ant bilaterally. REFLEXES: 2+ in right biceps and triceps (left not tested), and bilateral patella and achilles. Flexor plantar responses bilaterally. SENSORY: Intact to vibration and pinprick throughout. COORDINATION: No ataxia or dysmetria on finger-nose on the right, though range of motion is notably limited. GAIT: Normal casual and tandem gait.  Lab Results: LabObservation:  12-Dec-14 09:17   OBSERVATION Reason for Test  Routine Chem:  11-Dec-14 21:47   Glucose, Serum  282  BUN 12  Creatinine (comp) 1.17  Sodium, Serum  135  Potassium, Serum 3.9  Chloride, Serum 101  CO2, Serum 28  Calcium (Total), Serum 8.9  Anion Gap  6  Osmolality (calc) 280  eGFR (African American) >60  eGFR (Non-African American)  56 (eGFR values <43m/min/1.73 m2 may be an indication of chronic kidney disease (CKD). Calculated eGFR is useful in patients with stable renal function. The eGFR calculation will not be reliable in acutely ill patients when serum creatinine is changing rapidly. It is not useful in  patients on dialysis. The eGFR calculation may not be applicable to patients at the low  and high extremes of body sizes, pregnant women, and vegetarians.)  Result Comment D-DIMER - REPEATED AND CONFIRMED BY DILUTION  - TPL  Result(s) reported on 01 Mar 2013 at 12:25AM.  12-Dec-14 12:45   Result Comment CK - RESULTS VERIFIED BY REPEAT TESTING.  Result(s) reported on 01 Mar 2013 at 01:45PM.  13-Dec-14 04:15   Cholesterol, Serum 157  Triglycerides, Serum 161  HDL (INHOUSE)  61  VLDL Cholesterol Calculated 32  LDL Cholesterol Calculated 64 (Result(s) reported on 02 Mar 2013 at 05:41AM.)  Glucose, Serum  174  BUN 13  Creatinine (comp) 1.06  Sodium, Serum 137  Potassium, Serum 4.1  Chloride, Serum 106  CO2, Serum 27  Calcium (Total), Serum  8.4  Anion Gap  4  Osmolality  (calc) 278  eGFR (African American) >60  eGFR (Non-African American) >60 (eGFR values <20m/min/1.73 m2 may be an indication of chronic kidney disease (CKD). Calculated eGFR is useful in patients with stable renal function. The eGFR calculation will not be reliable in acutely ill patients when serum creatinine is changing rapidly. It is not useful in  patients on dialysis. The eGFR calculation may not be applicable to patients at the low and high extremes of body sizes, pregnant women, and vegetarians.)  Magnesium, Serum 2.1 (1.8-2.4 THERAPEUTIC RANGE: 4-7 mg/dL TOXIC: > 10 mg/dL  -----------------------)  Urine Drugs:  [id number]-90030:[id number]  Tricyclic Antidepressant, Ur Qual (comp) POSITIVE (Result(s) reported on 01 Mar 2013 at 05:13AM.)  Amphetamines, Urine Qual. NEGATIVE  MDMA, Urine Qual. NEGATIVE  Cocaine Metabolite, Urine Qual. NEGATIVE  Opiate, Urine qual POSITIVE  Phencyclidine, Urine Qual. NEGATIVE  Cannabinoid, Urine Qual. NEGATIVE  Barbiturates, Urine Qual. NEGATIVE  Benzodiazepine, Urine Qual. POSITIVE (----------------- The URINE DRUG SCREEN provides only a preliminary, unconfirmed analytical test result and should not be used for non-medical  purposes.  Clinical consideration and professional judgment should be  applied to any positive drug screen result due to possible interfering substances.  A more specific alternate chemical method must be used in order to obtain a confirmed analytical result.  Gas chromatography/mass spectrometry (GC/MS) is the preferred confirmatory method.)  Methadone, Urine Qual. NEGATIVE  Cardiac:  11-Dec-14 21:47   Troponin I < 0.02 (0.00-0.05 0.05 ng/mL or less: NEGATIVE  Repeat testing in 3-6 hrs  if clinically indicated. >0.05 ng/mL: POTENTIAL  MYOCARDIAL INJURY. Repeat  testing in 3-6 hrs if  clinically indicated. NOTE: An increase or decrease  of 30% or more on serial  testing suggests a  clinically important change)  12-Dec-14  12:45   CPK-MB, Serum  5.2  Troponin I < 0.02 (0.00-0.05 0.05 ng/mL or less: NEGATIVE  Repeat testing in 3-6 hrs  if clinically indicated. >0.05 ng/mL: POTENTIAL  MYOCARDIAL INJURY. Repeat  testing in 3-6 hrs if  clinically indicated. NOTE: An increase or decrease  of 30% or more on serial  testing suggests a  clinically important change)  14-Dec-14 05:31   CK, Total  1597 (Result(s) reported on 03 Mar 2013 at 06:39AM.)  Routine UA:  12-Dec-14 04:34   Color (UA) Yellow  Clarity (UA) Clear  Glucose (UA) 50 mg/dL  Bilirubin (UA) Negative  Ketones (UA) Negative  Specific Gravity (UA) 1.048  Blood (UA) Negative  pH (UA) 5.0  Protein (UA) Negative  Nitrite (UA) Negative  Leukocyte Esterase (UA) Negative (Result(s) reported on 01 Mar 2013 at 05:27AM.)  RBC (UA) <1 /HPF  WBC (UA) <1 /HPF  Bacteria (UA) NONE SEEN  Epithelial Cells (UA) 3 /HPF  Mucous (UA) PRESENT (Result(s) reported on 01 Mar 2013 at 05:27AM.)  Routine Coag:  11-Dec-14 21:47   D-Dimer, Quantitative  > 6.00 ("If the D-dimer test is being used to assist in the exclusion of DVT and/or PE, note the following:  In various studies concerning the D-dimer methodology (STA Liatest) in use by this laboratory, it has been reported that with a cut-off value of 0.50 ug/mL FEU, the  negative predictive value regarding the exclusion of thrombosis is within the 95-100% range."  In patients with high pre-test probability of DVT/PE the results of the D-dimer test should be correlated with other diagnostic and clinical assessment modalities. Reference: Franklin., 2005.)  Routine Hem:  11-Dec-14 21:47   WBC (CBC)  12.3  RBC (CBC) 3.96  Hemoglobin (CBC)  11.9  Hematocrit (CBC) 35.6  Platelet Count (CBC) 344  MCV 90  MCH 30.2  MCHC 33.5  RDW 13.4  Neutrophil % 80.2  Lymphocyte % 14.0  Monocyte % 5.2  Eosinophil % 0.3  Basophil % 0.3  Neutrophil #  9.9  Lymphocyte # 1.7  Monocyte # 0.6  Eosinophil # 0.0   Basophil # 0.0 (Result(s) reported on 28 Feb 2013 at 10:11PM.)   Radiology Results: [place]:    12-Dec-14 09:18, US Carotid Doppler Bilateral  US Carotid Doppler Bilateral   REASON FOR EXAM:    syncope  COMMENTS:       PROCEDURE: [place]  - US CAROTID DOPPLER BILATERAL  - Mar 01 2013  9:18AM     CLINICAL DATA:  Syncopal episode, history of hyperlipidemia and  diabetes    EXAM:  BILATERAL CAROTID DUPLEX ULTRASOUND    TECHNIQUE:  Pearline Cables scale imaging, color Doppler and duplex ultrasound were  performed of bilateral carotid and vertebral arteries in the neck.  COMPARISON:  None.    FINDINGS:  Criteria: Quantification of carotid stenosis is based on velocity  parameters that correlate the residual internal carotid diameter  with NASCET-based stenosis levels, using the diameter of the distal  internal carotid lumen as the denominator for stenosis measurement.    The following velocity measurements were obtained:    RIGHT    ICA:  105/33 cm/sec    CCA:  826/41 cm/sec  SYSTOLIC ICA/CCA RATIO:  1.0    DIASTOLIC ICA/CCA RATIO:  1.5    ECA:  112 cm/sec    LEFT    ICA:  95/29 cm/sec    CCA:  583/09 cm/sec    SYSTOLIC ICA/CCA RATIO:  0.9    DIASTOLIC ICA/CCA RATIO:  1.0  ECA:  85 cm/sec    RIGHT CAROTID ARTERY: There is a very minimal amount of concentric  intimal wall thickening within the right common carotid artery.  There is no grayscale evidence of significant atherosclerotic plaque  within the right carotid bulb or right internal carotid artery.  There are no elevated peak systolic velocities within the  interrogated course of the right internal carotid artery to suggest  a hemodynamically significant stenosis.    RIGHT VERTEBRAL ARTERY:  Antegrade flow    LEFT CAROTID ARTERY: There is a very minimal amount concentric  intimal wall thickening within the left common carotid artery. There  is a very minimal amount of intimal wall thickening within the left  carotid bulb  (image 44). There is no grayscale evidenceof  significant atherosclerotic plaque within the left internal carotid  artery. Borderline elevated peak systolic velocity within the  proximal aspect of the left internal carotid artery is felt  to be  factitiously elevated due to sampling at a location of vessel  tortuosity and at the periphery of the vessel (image 53). There are  otherwise non elevated peak systolic velocities within the mid and  distal aspects of the left internal carotid artery.    LEFT VERTEBRAL ARTERY:  Antegrade flow     IMPRESSION:  Very minimal amount of intimal wall thickening, left subjectively  greater than right, not definitely resulting in hemodynamically  significant stenosis.  Electronically Signed    By: Sandi Mariscal M.D.    On: 03/01/2013 09:24         Verified By: Aileen Fass, M.D.,  CT:    12-Dec-14 00:04, CT Head Without Contrast  CT Head Without Contrast   REASON FOR EXAM:    syncope  COMMENTS:       PROCEDURE: CT  - CT HEAD WITHOUT CONTRAST  - Mar 01 2013 12:04AM     CLINICAL DATA:  Syncope    EXAM:  CT HEAD WITHOUT CONTRAST    TECHNIQUE:  Contiguous axial images were obtained from the base of the skull  through the vertex without intravenous contrast.    COMPARISON:  Prior CT from 12/08/2012  FINDINGS:  There is no acute intracranial hemorrhage or infarct. No mass lesion  or midline shift. Gray-white matter differentiation is well  maintained. Ventricles are normal in size without evidence of  hydrocephalus. CSF containing spaces are within normal limits. No  extra-axial fluid collection.    The calvarium is intact.    Orbital soft tissues are within normal limits.    Small amount opacities present within the lateral recess of the  right sphenoid sinus. Otherwise, the paranasal sinuses and mastoid  air cells are well pneumatized and free of fluid.  Scalp soft tissues are unremarkable.     IMPRESSION:  No acute intracranial  process.      Electronically Signed    By: Jeannine Boga M.D.    On: 03/01/2013 00:09         Verified By: Neomia Glass, M.D.,   Impression/Recommendations: Recommendations:   IMPRESSION: [name] is a 49 yo right-handed woman with a history of DM, HTN, schizophrenia and depression who presented with a fall a bilateral shoulder fractures. Her neurologic exam, while limited to a degree by her fractures, does not reveal any localizing deficits. Head CT without contrast does not show any intracranial abnormality.  circumstances in which she was found - in the bathroom, door locked, underpants around her knees - suggest that she may have experienced vasovagal syncope while on or after standing from the commode. The presence of bilateral shoulder fractures is not necessarily in itself concerning for convulsive seizure activity; this may have been caused by her fall, and there were no tongue lacerations to suggest large-scale generalized convulsions. However, the retrograde memory loss is a bit unusual for pure vasovagal syncope and keeps an epileptic event in the differential diagnosis. With no other historical data to strongly suggest seizures, I would recommend deferring initiation of an anti-epileptic medication at this point, but would check an EEG to rule out interictal epileptiform discharges suggestive of seizure tendency. no seizure medication at this timecheck EEG to evaluate for seizure tendency. If epileptiform abnormalities present, would recommend loading with levetiracetam 1521m and starting levetiracetam 75105mBID (weight is 112kg) you for the opportunity to participate in [name]'s care. Please page neurology consults with further questions.  Electronic Signatures: HeCarmin RichmondMD)  (Signed  14-Dec-14 11:23)  Authored: Consult, History of Present Illness, Review of Systems, PAST MEDICAL/SURGICAL HISTORY, HOME MEDICATIONS, ALLERGIES, Social/Family History,  NURSING VITAL SIGNS, Physical Exam-, LAB RESULTS, RADIOLOGY RESULTS, Recommendations   Last Updated: 14-Dec-14 11:23 by Carmin Richmond (MD)

## 2014-07-11 NOTE — H&P (Signed)
PATIENT NAME:  [name], HUOT MR#:  [id number] DATE OF BIRTH:  June 14, 1965  DATE OF ADMISSION:  07/11/2012  PRIMARY CARE PHYSICIAN: Dr. Rexanne Mano.   REFERRING PHYSICIAN: Dr. Lavonia Drafts.   CHIEF COMPLAINT: Swelling all over the body.   HISTORY OF PRESENT ILLNESS: The patient is a 49 year old, pleasant, African American female with a past medical history of diabetes mellitus insulin dependent, hypertension, morbid obesity, chronic pain on multiple pain medications. Had recent gastric bypass surgery on 04/22/2012. Since the surgery, the patient has been losing weight and has lost about 40 pounds. The patient has not been eating. Mainly blood pressure medications as well as diabetic medications. Cut down on the insulin dose; however, the patient was not told to cut down on the blood pressure medications. The patient continues to take all of the blood pressure medications, including lisinopril, metoprolol, hydrochlorothiazide. The patient has been experiencing lightheaded and with frequent falls. About 2 weeks back, the patient ran out of the pain medications and since then has been taking ibuprofen for the pain. Denies having any recent cough, cold, nausea, vomiting or diarrhea. Concerning the overall swelling of the body, came to the Emergency Department. Per the patient, the patient's baseline creatinine of 1.4 worsened to 3.4. The patient was also found to have low systolic blood pressure of 85 at the time of the presentation. The patient was given 1 liter of fluid with improvement of the blood pressure. Currently, urinalysis is pending.   PAST MEDICAL HISTORY:  1. Diabetes mellitus, insulin dependent.  2. Hypertension.  3. Hyperlipidemia.  4. Morbid obesity.  5. Chronic back pain.  6. Depression, anxiety.   7. Osteoarthritis.   PAST SURGICAL HISTORY:  1. Partial hysterectomy.  2. Total knee replacement.  3. Gastric bypass surgery.  4. Cholecystectomy.   ALLERGIES: PENICILLIN AND  MORPHINE.   HOME MEDICATIONS:  1. Vitamin B12 2000 mcg orally.  2. Trazodone 150 mg at bedtime.  3. Topiramate 50 mg 1 tablet 2 times a day.  4. Tizanidine 4 mg 2 tablets every 6 to 8 hours as needed.  5. Sertraline 100 mg once a day.  6. Seroquel XR 400 mg 2 tablets at bedtime.  7. Phenergan 25 mg every 4 hours as needed.  8. Omeprazole 40 mg once a day.  9. NovoLog Flex Pen 32 units subcutaneous 3 times a day with meals.  10. Lantus 50 units daily.  11. Multivitamin 1 tablet daily.  12. Metoprolol-XL 25 mg daily.  13. Meloxicam 7.5 mg once a day.  14. Lisinopril 20 mg daily.  15. Lamotrigine 150 mg once a day.  16. Dilaudid 4 mg 1 tablet every 8 hours as needed.  17. Hydrochlorothiazide 25 mg once a day.  18. Gabapentin 800 mg 4 times a day.  19. Doculase 1 capsule 2 times a day.  20. Clindamycin 150 mg 4 tablets 1 hour before dental appointment.  21. Atorvastatin 20 mg 1 tablet once a day.   SOCIAL HISTORY: No history of smoking, drinking alcohol or using illicit drugs. Lives by herself. Disabled.   FAMILY HISTORY: Sister with kidney problems and heart problems. Mother died of complications from diabetes mellitus.   REVIEW OF SYSTEMS:  CONSTITUTIONAL: Generalized weakness, fatigue, generalized body aches.  EYES: No change in vision. No discharge from the eyes.  ENT: No change in hearing. No sore throat.  RESPIRATORY: Has mild shortness of breath. No cough.  CARDIOVASCULAR: No chest pain. Has been experiencing palpitations.  GASTROINTESTINAL: Has nausea. No  diarrhea, abdominal pain.  GENITOURINARY: Decreased urine output. No hematuria. No dysuria.  SKIN: No rash or lesions.  MUSCULOSKELETAL: Has generalized body aches.  ENDOCRINE: No polyuria or polydipsia.  NEUROLOGIC: No weakness or numbness.  PSYCHIATRIC: History of depression.   PHYSICAL EXAMINATION:  GENERAL: This is a well-built, well-nourished, morbidly obese female, looks appropriate for the stated age. Has  diffuse swelling all over the body.  VITAL SIGNS: Temperature 98.4, pulse 106, blood pressure 100/63, respiratory rate of 18, oxygen saturation 98% on room air.  HEENT: Head normocephalic, atraumatic. Eyes: No scleral icterus. Conjunctivae normal. Pupils equal and react to light. Extraocular movements are intact. Mucous membranes mild dryness. No pharyngeal erythema.  NECK: Supple. No lymphadenopathy. No JVD. No carotid bruit. No thyromegaly.  CHEST: Has no focal tenderness.  LUNGS: Occasional crackles in the bases.  HEART: S1, S2, regular, tachycardia. Has pedal edema, puffiness in both lower extremities. Pulses 2+.   ABDOMEN: Bowel sounds present. Soft, nontender, nondistended, obese abdomen. Could not appreciate hepatosplenomegaly.  MUSCULOSKELETAL: Good range of motion in all of the extremities.  SKIN: No rash or lesions.  LYMPHATIC: No cervical, axillary or inguinal lymphadenopathy.  NEUROLOGIC: The patient is alert, oriented to place, person and time. Cranial nerves II through XII intact. Motor 5/5 in upper and lower extremities. No sensory deficits.   LABS: CMP: BUN 34, creatinine of 3.34. The rest of all of the values are within normal limits. CBC: WBC of 12.3, hemoglobin 12.8, platelet count of 430. Troponin less than 0.02.   ASSESSMENT AND PLAN: The patient is a 49 year old female, comes to the Emergency Department with acute renal failure.  1. Acute renal failure: This seems to be multifactorial. The patient has underlying diabetic nephropathy with baseline creatinine of 1.4, and the patient has been having low blood pressure and continues to take her medications. The patient also continues to take hydrochlorothiazide which might be causing further dehydration, as well as the patient has been taking nonsteroidal anti-inflammatory drugs for back pain. Will hold all of the blood pressure medications, including lisinopril, hydrochlorothiazide. Continue with gentle hydration. Will allow blood  pressure to increase greater than 140. Consult nephrology. Will obtain ultrasound of the kidneys. Also, concern about if the patient has underlying nephrotic syndrome; however, the patient's albumin is 4. Will check the urinalysis and a morning sample of protein/creatinine ratio to assess 24-hour urine protein.  2. Hypotension: This is secondary to loss of weight and decreased requirement on the blood pressure medications. For now, will hold all blood pressure medications. No signs of infection are evident; however, urinalysis is currently pending.  3. Diabetes mellitus: The patient states has decreased insulin requirement. Concerning the patient's renal insufficiency, decrease the dose of the Lantus to 25 units from 50 units. Keep the patient on NovoLog 10 units instead of 32 units 3 times with meals. Keep the patient on sliding scale insulin. Will obtain hemoglobin A1c.  4. Morbid obesity: The patient has lost about 40 pounds of weight and continues to lose weight. The patient states started feelingbetter after she has been losing weight.  5. Chronic pain: Counseled extensively with the patient regarding increasing exercise. The patient expressed understanding and also stated that would like to come off of the pain medications as much as she can. Will keep the patient on Percocet for now as the patient has not been taking any opiates for the last 2 weeks and also the patient is on multiple sedative medications, gabapentin, , Seroquel, Phenergan.   6.  Hypotension: Will hold all blood pressure medications.  7. Keep the patient on deep vein thrombosis prophylaxis with heparin.   TIME SPENT: 60 minutes.   ____________________________ Monica Becton, MD pv:gb D: 07/11/2012 01:17:34 ET T: 07/11/2012 01:33:09 ET JOB#: [id number]  cc: Monica Becton, MD, <Dictator> Dr. Barbette Merino Kaliq Lege MD ELECTRONICALLY SIGNED 07/11/2012 8:43

## 2014-07-11 NOTE — H&P (Signed)
PATIENT NAME:  [name], [name] MR#:  [id number] DATE OF BIRTH:  [birth year]  DATE OF ADMISSION:  08/04/2012  PRIMARY CARE PHYSICIAN:  Dr. Rexanne Mano.   REFERRING PHYSICIAN:  Dr. Elyn Peers.   CHIEF COMPLAINT:  Nausea, vomiting.  HISTORY OF PRESENT ILLNESS:  [name] is a 49 year old morbidly obese white female with a past medical history of hypertension, hyperlipidemia, diabetes mellitus insulin-dependent, who underwent a recent gastric bypass surgery on 04/22/2012.  Since the surgery, the patient has not followed up with her surgeon secondary to transportation problems.  The patient had recent admission in April 2014 for acute renal failure felt to be from hypotension from the blood pressure medications.  After adjusting the medications the patient's kidney function improved and was discharged home.  The patient states has been having nausea, vomiting for the last three days, could not keep down any food.  The patient states is able to keep down her medication.  In the Emergency Department, the patient is tachycardic, was given IV fluids.  Denies having any cough, shortness of breath.  Denies having any diplopia.  Lab work does not show any obvious dehydration.  It shows mild elevation of the WBC count of 12.9.  CT abdomen and pelvis done in the Emergency Department, no evidence of bowel obstruction or ileus.  Did not show any abnormality.  The patient received multiple doses of Dilaudid in the Emergency Department for abdominal pain.   PAST MEDICAL HISTORY: 1.  Hypertension.  2.  Diabetes mellitus, insulin-dependent.  3.  Morbid obesity with recent bypass surgery.  4.  Chronic back pain.  5.  Depression, anxiety.  6.  Osteoarthritis.   PAST SURGICAL HISTORY: 1.  Partial hysterectomy.  2.  Total knee replacement.  3.  Cholecystectomy.   ALLERGIES:  PENICILLIN AND MORPHINE.   HOME MEDICATIONS: 1.  Dilaudid 4 mg 1 tablet every 8 hours.  2.  Hydrochlorothiazide 25 mg once a day.  3.   Gabapentin 800 mg 4 times a day.  4.  Etodolac 500 mg once a day.  5.  Docusate sodium 1 capsule 2 times a day.  6.  Atorvastatin 80 mg once a day.   SOCIAL HISTORY:  No history of smoking, drinking alcohol or using illicit drugs.  Lives by herself, disabled.   FAMILY HISTORY:  Sister with kidney problems and heart problems.  Mother died of complications from the diabetes mellitus.   REVIEW OF SYSTEMS: CONSTITUTIONAL:  Generalized weakness, fatigue.  EYES:  No change in vision.  EARS, NOSE, THROAT:  No change in hearing.  No sore throat.  RESPIRATORY:  No cough, shortness of breath.  CARDIOVASCULAR:  No chest pain, palpitations.  GASTROINTESTINAL:  Nausea, vomiting.  GENITOURINARY:  No dysuria or hematuria.  SKIN:  No rash or lesions.  MUSCULOSKELETAL:  Generalized body aches.  NEUROLOGIC:  No weakness or numbness.  PSYCHIATRIC:  History of depression.   PHYSICAL EXAMINATION: GENERAL:  This is a morbidly obese female lying down in the bed, not in distress.  VITAL SIGNS:  Temperature 98.6, pulse 110, blood pressure 141/78, respiratory rate of 20, oxygen saturations 97% on room air.  HEENT:  Head normocephalic, atraumatic.  Eyes, no scleral icterus.  Conjunctivae normal.  Pupils equal and react to light.  Extraocular movements are intact.  Mucous membranes moist.  No pharyngeal erythema.  NECK:  Supple.  No lymphadenopathy.  No JVD.  No carotid bruit.  Could not examine the thyromegaly.  CHEST:  Has no focal tenderness.  LUNGS:  Bilaterally clear to auscultation.  HEART:  S1 and S2, regular, tachycardia.  No pedal edema.  Pulses 2+.  ABDOMEN:  Bowel sounds are plus.  Soft.  Mild tenderness in the epigastric area.  No rebound or guarding.  MUSCULOSKELETAL:  Good range of motion in all the extremities.  SKIN:  No rash or lesions.  NEUROLOGIC:  The patient is alert, oriented to place, person and time.  Cranial nerves II through XII intact.  No motor and sensory deficits.   LABORATORY  DATA:  UA negative for nitrites and leukocyte esterase.  CMP, glucose 166, BUN 18, creatinine of 1.39.  The rest of all the values are within normal limits.   CBC, WBC of 12.9, hemoglobin 13.7, platelet count of 467.   CT abdomen and pelvis, as mentioned above, no acute intra-abdominal process to explain patient's abdominal pain.  No evidence of bowel obstruction or ileus.   ASSESSMENT AND PLAN:  [name] is a 49 year old female with recent gastric bypass surgery, has not followed up with her physician comes to the Emergency Department with nausea and vomiting.  1.  Nausea, vomiting.  We will keep the patient nothing by mouth except medication.  Continue with IV fluids.  Give antinausea medications.  Strongly consider not to give any IV narcotics.  2.  Chronic pain, cause is uncertain.  We will continue the home medications.  3.  Hypertension, currently well-controlled.  Continue the home medications.  4.  Diabetes mellitus, insulin-dependent.  Continue the current dose.  5.  Morbid obesity.  The patient states continues to lose weight after the bypass surgery.  6.  Keep the patient on deep vein thrombosis prophylaxis with Lovenox.   TIME SPENT:  45 minutes.    ____________________________ Monica Becton, MD pv:ea D: 08/04/2012 04:23:41 ET T: 08/04/2012 06:38:48 ET JOB#: [id number]  cc: Monica Becton, MD, <Dictator> Dr. Jacinto Halim Latravion Graves MD ELECTRONICALLY SIGNED 08/07/2012 7:44

## 2014-07-11 NOTE — H&P (Signed)
PATIENT NAME:  [name], LUTZ MR#:  [id number] DATE OF BIRTH:  1965-07-25  DATE OF ADMISSION:  03/01/2013  PRIMARY CARE PHYSICIAN:  Dr. Rexanne Mano.   REFERRING PHYSICIAN:  Dr. Owens Shark.   CHIEF COMPLAINT:  Syncope.   HISTORY OF PRESENT ILLNESS:  The patient is a 49 year old African American female with a past medical history of diabetes mellitus, hypertension, schizophrenia, chronic low back pain, depression, morbid obesity, who is presenting to the ER with a chief complaint of a syncopal episode.  The patient is reporting that she was in her usual state of health and watching TV until 6:30 p.m. yesterday.  Following that, she was found on the bathroom floor by her husband at around 7:30 p.m.  The patient could not recall that event of passing out.  She does not know when she went to the bathroom and could not recall when she passed out and no one witnessed this syncopal episode.  Following the syncopal episode, the patient was complaining of left-sided chest pain and left shoulder pain.  Her son called EMS and the patient was brought into the ER.  In the ER, CAT scan of the head was negative.  The patient's d-dimer was elevated so CT angiogram of the chest was done and pulmonary embolism was ruled out.  As the patient was complaining of left shoulder pain, a left shoulder x-ray was done which has revealed a comminuted factor of the greater tuberosity of the humerus.  The patient was placed on sling and she was given Toradol.  THE PATIENT IS REPORTING THAT SHE IS ALLERGIC TO MORPHINE AS IT CAUSES ITCHING, BUT SHE DENIES ANY HIVES OR RASHES.  The patient was still complaining of chest pain which was constant in nature and cramping in nature which started after she had this syncopal episode.  The patient's initial troponin is less than 0.02.  Denies any other complaints of nausea, diaphoresis.  She felt dizzy after syncopal episode while she was trying to get up from the bathroom floor.  No similar complaints in  the past.  She admits that she has history of schizophrenia.  Denies any nausea, vomiting, diaphoresis.  The patient is complaining of intermittent episodes of stuttering.  She also reported that she stutters whenever she becomes hypoglycemic with low blood sugars.  Denies any weakness in the upper or lower extremities other than the left shoulder pain.  PAST MEDICAL HISTORY:  Diabetes mellitus, hypertension, chronic low back pain, morbid obesity, depression, anxiety, schizophrenia, bipolar disorder, osteoarthritis.   PAST SURGICAL HISTORY:  Gastric bypass in February 2014 and lost 50 pounds so far.  Total knee replacement, partial hysterectomy, cholecystectomy.   ALLERGIES:  PENICILLIN, MORPHINE, STATES THAT MORPHINE GIVES HER A RASH.   PSYCHOSOCIAL HISTORY:  Lives at home with husband and son, disabled.  Denies any history of smoking, alcohol or illicit drug usage.   FAMILY HISTORY:  Sister with cardiac problems and renal problems.  Mother died from diabetes mellitus complications.   HOME MEDICATIONS:  Vitamin D3 2 tablets once daily, tramadol 50 mg q. 6 hours, sertraline 100 mg 2 tablets once a day, Seroquel extended release 400 mg 2 tablets once daily, promethazine 25 mg q. 4 hours, furosemide 20 mg once daily, lorazepam 0.5 mg once a day, atorvastatin 80 mg once daily, Januvia 25 mg 1 tablet once daily, Zoloft 100 mg 2 tablets once daily.   REVIEW OF SYSTEMS: CONSTITUTIONAL:  Denies any fever or fatigue.  EYES:  Denies blurry vision or  glaucoma.  EARS, NOSE, THROAT:  Denies epistaxis, discharge.  RESPIRATION:  Denies cough, COPD.  CARDIOVASCULAR:  Complaining of constant cramping, left-sided chest pain radiating to the left shoulder following syncopal episode after she landed on the left shoulder.  Denies any palpitations.  Had a syncopal episode.  GASTROINTESTINAL:  Denies nausea, vomiting, diarrhea.  GENITOURINARY:  No dysuria, hematuria, renal calculi. GYNECOLOGIC AND BREAST:  Denies  any breast mass, had a partial hysterectomy in the past.  ENDOCRINE:  Denies polyuria, nocturia.  Has diabetes mellitus.  Following gastric bypass she was off insulin, just taking Januvia.  INTEGUMENTARY:  No acne, rash, lesions. HEMATOLOGIC:  No anemia, easy bruising, bleeding.  MUSCULOSKELETAL:  Has chronic low back pain, complaining of left shoulder pain following a syncopal episode.   NEUROLOGIC:  Denies any vertigo, ataxia or dementia.  Complaining of stuttering intermittently.  PSYCHIATRIC:  No ADD, but complaining of schizophrenia, bipolar disorder.   PHYSICAL EXAMINATION: VITAL SIGNS:  Temperature 97.9, pulse 108, respirations 18, blood pressure 137/90, pulse ox 95% pulse.  GENERAL APPEARANCE:  Not under acute distress.  Morbidly obese.  HEENT:  Normocephalic, atraumatic.  Pupils are equally reacting to light and accommodation.  No scleral icterus.  No conjunctival injection.  No sinus tenderness.  No postnasal drip.  Dry mucous membranes.  NECK:  Supple.  No JVD.  No thyromegaly.  Range of motion is intact.  LUNGS:  Clear to auscultation bilaterally.  No accessory muscle usage.  Positive anterior chest wall tenderness on the left side of the chest which is reproducible.  CARDIAC:  S1, S2 normal.  Regular rate and rhythm.  No murmurs.  GASTROINTESTINAL:  Soft, obese.  Bowel sounds are positive in all four quadrants.  Nontender, nondistended.  No hepatosplenomegaly.  No masses felt.  NEUROLOGIC:  Awake, alert, oriented x 3.  Motor and sensory are grossly intact.  Reflexes are 2+ except in the left shoulder area, which is secured in a sling as the patient has a left humerus comminuted fracture.  MUSCULOSKELETAL:  Left shoulder is tender, abducted, internally rotated, secured in a sling as she sustained a left humeral comminuted fracture.  Range of motion of the right shoulder is slightly limited because of the pain following the fall.  Bilateral lower extremities are intact.  Reflexes are 2+.   Cranial nerves II through XII are grossly intact.  Chronic low back pain is present.  Except left shoulder, other joints are intact grossly. EXTREMITIES:  No edema.  No cyanosis.  No clubbing.  SKIN:  Warm to touch.  Normal turgor.  No rashes.  No lesions.  PSYCHIATRIC:  Normal mood and affect.   LABORATORY AND IMAGING STUDIES:  Accu-Chek 257, glucose 282, BUN 12, creatinine 1.17, sodium 135, potassium 3.9, chloride 101, CO2 28.  Anion gap 6.  GFR greater than 60, serum osmolality 280, calcium 8.9, troponin less than 0.02.  WBC 12.3, hemoglobin 11.9, platelets are 344.  D-dimer is greater than 60.  Urine drug screen is pending.  CT head, no acute findings.  CT angiogram of the chest:  No pulmonary embolism, mild left-sided atelectasis.  There is bilateral pleural fluid, lungs otherwise are clear.  Left shoulder x-ray:  Comminuted fracture of the greater tuberosity of the humerus, suspected comminuted fracture of the glenoid.  A 12-lead EKG:  Sinus tachycardia at 111 beats per minute, normal PR and QRS interval, no acute ST-T wave changes.   ASSESSMENT AND PLAN:  A 49 year old [place] American female brought into the ER  via EMS after she sustained a syncopal episode. 1.  Syncope, probably cardiogenic.  Differential diagnosis vasovagal versus orthostatic.  CT head is negative.  We will put her on telemetry.  Cycle cardiac biomarkers.  Acute coronary syndrome protocol.  Cardiology consult to Dr. Saralyn Pilar.  We will obtain carotid Dopplers and 2-D echocardiogram.   Neuro checks.  Orthostatics.   The patient will be on aspirin, beta-blocker and statin.  Urine drug screen is pending.  2.  Left shoulder comminuted fracture following syncopal episode.  Pain management with Toradol.  Ortho consult is placed.  Continue sling.  PT consult is placed.  3.  Diabetes mellitus.  The patient will be on sliding scale insulin and continue home medication Januvia.  4.  Hypertension.  Blood pressure is stable.  Resume  home medications.  5.  History of schizophrenia.  Resume her psychiatric medication including Seroquel.  6.  We will provide her gastrointestinal prophylaxis and deep vein thrombosis prophylaxis.  7.  SHE IS FULL CODE.  Husband is the medical power of attorney.   Diagnosis and plan of care was discussed in detail with the patient.  She is aware of the plan.    Total time spent on admission is 50 minutes.     ____________________________ Nicholes Mango, MD ag:ea D: 03/01/2013 04:10:35 ET T: 03/01/2013 06:03:06 ET JOB#: [id number]  cc: Nicholes Mango, MD, <Dictator> Nicholes Mango MD ELECTRONICALLY SIGNED 03/05/2013 0:06

## 2014-07-12 NOTE — Consult Note (Signed)
Chief Complaint:  Subjective/Chief Complaint seen for anemia.  admitted anemia after coming to hospital  a fall ( fall was several weeks ago).  denies n/v tolerating po.  no black or bloody stool, hemodynamically stable. main complaint sob and musculoskenetal pain from fall.   VITAL SIGNS/ANCILLARY NOTES: **Vital Signs.:   03-Jan-15 06:03  Vital Signs Type Routine  Temperature Temperature (F) 98.3  Celsius 36.8  Pulse Pulse 86  Respirations Respirations 20  Systolic BP Systolic BP 060  Diastolic BP (mmHg) Diastolic BP (mmHg) 89  Mean BP 104  Pulse Ox % Pulse Ox % 97  Pulse Ox Activity Level  At rest  Oxygen Delivery Room Air/ 21 %   Brief Assessment:  Cardiac Regular   Respiratory clear BS   Gastrointestinal details normal Soft  Nontender  Nondistended  Bowel sounds normal  No gaurding  obese   Lab Results:  Routine Chem:  02-Jan-15 06:31   Iron Binding Capacity (TIBC) 312  Unbound Iron Binding Capacity 253  Iron, Serum 59  Iron Saturation 19 (Result(s) reported on 22 Mar 2013 at 09:44AM.)  Ferritin [facility]) 114 (Result(s) reported on 22 Mar 2013 at 04:59XH.)  Folic Acid, Serum 8.9 (Result(s) reported on 22 Mar 2013 at 09:58AM.)  Cardiac:  02-Jan-15 06:31   CK, Total  1425 (Result(s) reported on 22 Mar 2013 at 08:03AM.)  Routine Hem:  02-Jan-15 06:31   WBC (CBC) 6.7  RBC (CBC)  3.66  Hemoglobin (CBC)  10.7  Hematocrit (CBC)  32.8  Platelet Count (CBC) 412  MCV 90  MCH 29.3  MCHC 32.7  RDW 13.9  Neutrophil % 64.4  Lymphocyte % 26.7  Monocyte % 6.5  Eosinophil % 2.0  Basophil % 0.4  Neutrophil # 4.3  Lymphocyte # 1.8  Monocyte # 0.4  Eosinophil # 0.1  Basophil # 0.0 (Result(s) reported on 22 Mar 2013 at 07:27AM.)   Radiology Results: XRay:    01-Jan-15 11:01, Shoulder Left Complete  Shoulder Left Complete   REASON FOR EXAM:    shoulder fracture follow up  COMMENTS:       PROCEDURE: DXR - DXR SHOULDER LEFT COMPLETE  - Mar 21 2013 11:01AM     CLINICAL  DATA:  Shoulder fracture followup.  [place] again.    EXAM:  DG SHOULDER 3+VIEWS LEFT    COMPARISON:  CT shoulder 03/01/2013 and left shoulder radiographs  03/01/2013    FINDINGS:  The left humeral head is located. Previously described fracture  through the greater tuberosity of the humerus appears without  significant change. Bony density seen just inferior to the left  glenoid is consistent with the known inferior glenoid fracture  fragment. The left clavicle is intact and acromioclavicular joint is  aligned.     IMPRESSION:  No significant radiographic change in the appearance of the fracture  through the greater tuberosity of the left humerus. Inferior glenoid  fracture fragment is visualized, but best seen on prior radiographs  and prior CT. No definite acute bony fracture is seen compared to  recent studies dated 03/01/2013.      Electronically Signed    By: Curlene Dolphin M.D.    On: 03/21/2013 11:39         Verified By: Sheppard Evens, M.D.,    01-Jan-15 11:01, Shoulder Right Complete  Shoulder Right Complete   REASON FOR EXAM:    right shoulder  fracture folow up  COMMENTS:       PROCEDURE: DXR - DXR SHOULDER RIGHT  COMPLETE  - Mar 21 2013 11:01AM     CLINICAL DATA:  Fall.  Bilateral previous shoulder fractures.    EXAM:  DG SHOULDER 3+ VIEWS RIGHT    COMPARISON:  Right shoulder radiographs and shoulder CT 03/01/2013  and chest radiograph 03/20/2013    FINDINGS:  Again seen is a fracture of the greater tuberosity of right humerus.  The fracture fragments are laterally displaced. Fracture fragments  appear more displaced on today's radiograph compared to the AP view  of the right shoulder dated 03/01/2013. Additionally, there is a  fracture fragment of the inferior glenoid, which was previously  described on shoulder CT. The acromioclavicular joint is aligned. No  new fractures are identified. The humeral head is located.     IMPRESSION:  Comminuted  displaced fracture of the greater tuberosity of the right  humerus. This fracture was previously described on studies performed  03/01/2013. The fracture fragments may be more laterally displaced,  versus differences in patient positioning.    No change in radiographic appearance of the inferior glenoid  fracture.  Electronically Signed    By: Curlene Dolphin M.D.    On: 03/21/2013 11:54         Verified By: Sheppard Evens, M.D.,   Assessment/Plan:  Assessment/Plan:  Assessment 1) anemia, possible drop of hgb related to lab discrepancy.  hemodynamically stable.no overt evidence of GI bleeding.  h/o gastric bypass 04/2012 at [facility].   2) "comminuted displaced fracture of the right humerus and inferior glenoid fx"/recent bilateral shoulder fx.  apparently from recent fall.  Seen by orthopedics 03/02/13 with non-operative care recommended.   Plan 1) continue current, possible egd when clinically feasible.  serial/daily hgb.  Will hold on colonoscopy unless there is clinically evident gi bleeding.   Electronic Signatures: Loistine Simas (MD)  (Signed 03-Jan-15 13:54)  Authored: Chief Complaint, VITAL SIGNS/ANCILLARY NOTES, Brief Assessment, Lab Results, Radiology Results, Assessment/Plan   Last Updated: 03-Jan-15 13:54 by Loistine Simas (MD)

## 2014-07-12 NOTE — Consult Note (Signed)
Brief Consult Note: Diagnosis: Anemia, Chest pain.   Patient was seen by consultant.   Comments: [name] is a 49 y/o black female admitted with anemia & chest pain just 1 day after being discharged for hypoglycemia & fall.  Her Hgb was 6.9, however I feel this was a lab error as she received a unit of PRBCs & hgb was back up to 11 range.  It was in 10-range at discharge.  None the less, she has a mild normocytic anemia & is 10 months s/p gastric bypass at [facility].  She is having chest pain & heartburn & will need EGD to look for anastomotic ulcer or erosions.  Carafate & PPI are the treatment for above so she is already empirically covered.  She is also due for average risk screening colonoscopy which can be done outpatient.  She was hemoccult negative.    Plan: 1) Agree with PPI 2) I have discussed her care with Dr Allen Norris.  Since pt had breakfast EGD cannot be done today.  It can be done Monday if pt unable to be discharged home over the weekend, or we can set up as outpatient procedure with Dr Allen Norris next week.   3) outpatient colonoscopy 4) anemia panel 5) carafate 1 G QID Thanks for consult.  Please see full dictated note. #[id number].  Electronic Signatures: Andria Meuse (NP)  (Signed 02-Jan-15 10:48)  Authored: Brief Consult Note   Last Updated: 02-Jan-15 10:48 by Andria Meuse (NP)

## 2014-07-12 NOTE — H&P (Signed)
PATIENT NAME:  [name], [name] MR#:  [id number] DATE OF BIRTH:  [birth year]  DATE OF ADMISSION:  03/16/2013  REFERRING PHYSICIAN: Dr. Lavonia Drafts.   PRIMARY CARE PHYSICIAN: E.B. White.   REASON FOR ADMISSION: Hypoglycemia and fall.   HISTORY OF PRESENT ILLNESS: This is a very nice 49 year old female who has a history of diabetes, hypertension, chronic back pain, morbid obesity, depression, anxiety, schizophrenia, bipolar disorder and osteoarthritis, who was recently admitted over here on 03/01/2013 after a syncopal episode. The patient was evaluated by neurology, and they recommended no seizure medications. There was a question about possible seizure activity in the patient. The patient was discharged in good condition. She was found to have bilateral shoulder blade fractures. Today the patient went to the store with her sons, and she was on a motorized shopping cart at the grocery store and started jerking, very slow jerks, not tonic-clonic, no significant seizure activity for what the sons can describe, but she fell down and slumped over and fell down on her face.   Here in the ER, the patient was found to be hypoglycemic with a glucose of 50, for which that was corrected. The patient was told that she was going to be discharged when the patient reached normal blood sugars, but then as soon she was told that, she started getting anxious and started having some what looked like conversion crisis with her eyes open and twitching. She was never postictal. She never passed out. She regained good amount of consciousness after her blood sugar was corrected earlier.   The patient is admitted for evaluation of her hypoglycemia, as she continues to have severe symptoms. The patient has not had any changes in her insulin dose, but she states that she did not eat anything today. Education given to the patient as far as how to control hypoglycemia.   REVIEW OF SYSTEMS: A 12-system review of systems is done.   CONSTITUTIONAL: No fever, fatigue, weight loss or weight gain.  EYES: No blurry vision, double vision. She has a contusion at the level of her eye with some ecchymosis of the sclera, but no intra-chamber bleeding.  RESPIRATORY: No shortness of breath or wheezing. EARS, NOSE, THROAT: No difficulty swallowing. No tinnitus.  GASTROINTESTINAL: No nausea, vomiting, abdominal pain, constipation or diarrhea.  GENITOURINARY: No dysuria or hematuria.  ENDOCRINE: No polyuria, polydipsia, polyphagia, cold or heat intolerance.  HEMATOLOGIC AND LYMPHATIC: No anemia, easy bruising or bleeding.  SKIN: No rashes or petechiae.  MUSCULOSKELETAL: No significant neck pain or back pain.  NEUROLOGIC: No numbness, tingling or CVAs. The patient states that she thinks she has seizures, although by prior examination by neurology, this information does not seem to be accurate. She was not diagnosed with seizure at all.   PAST MEDICAL HISTORY: 1.  Diabetes.  2.  Hypertension.  3.  Chronic pain.  4.  Chronic low back pain.  5.  Morbid obesity.  6.  Depression.  7.  Anxiety. 8.  Schizophrenia.  9.  Bipolar disorder.  10.  Osteoarthritis.   PAST SURGICAL HISTORY: 1.  Gastric bypass in February, lost 50 pounds.  2.  Total knee replacement.  3.  Partial hysterectomy.  4.  Cholecystectomy.   ALLERGIES: PENICILLIN AND MORPHINE.   SOCIAL HISTORY: Lives at home with her husband and son. She is disabled. No smoking. No alcohol or drug use.   FAMILY HISTORY: Positive for cardiac problems in her mother, who died from diabetes.   Dictation ends here.  Please see  addendum for continuation of dictation.    ____________________________ Early Sink, MD rsg:jcm D: 03/16/2013 19:16:00 ET T: 03/16/2013 19:30:55 ET JOB#: [id number]  cc: [place] Sink, MD, <Dictator> [place] MD ELECTRONICALLY SIGNED 04/03/2013 23:10

## 2014-07-12 NOTE — Discharge Summary (Signed)
PATIENT NAME:  [name], [name] MR#:  [id number] DATE OF BIRTH:  February 07, 1966  DATE OF ADMISSION:  03/20/2013 DATE OF DISCHARGE:  03/23/2013    CONSULTANTS: Dr. Gustavo Lah from GI.   CHIEF COMPLAINT: Chest pain.   PRIMARY CARE PHYSICIAN: Benjamine Mola B. White, FNP   DISCHARGE DIAGNOSES: 1.  Chest pain, likely musculoskeletal in nature.  2.  Drop in hemoglobin acutely, likely lab error.  3.  History of schizophrenia.  4.  Hypertension.  5.  Chronic pain.  6.  Chronic lumbago.  7.  Morbid obesity.  8.  Depression.  9.  Anxiety.  10.  Bipolar disorder.  11.  Osteoarthritis.  12.  Diabetes.  13.  Recent bilateral humeral fractures.  14.  Mild rhabdomyolysis.  DISCHARGE MEDICATIONS: Atorvastatin 80 mg daily, vitamin D3 at 1000 units 2 tabs once a day, furosemide 20 mg daily, gabapentin 800 mg 4 times a day, promethazine 25 mg every 4 hours as needed, tizanidine 4 mg 1 to 2 tabs every 6 to 8 hours as needed for muscle spasms, sertraline 200 mg once a day, clonazepam 0.5 mg at bedtime, Seroquel XR 400 mg 2 caps once a day in evening, etodolac extended-release 500 mg once a day, Percocet 7/325 one tab every 6 hours as needed for pain, Dilaudid 4 mg every 6 hours as needed for pain, aspirin 81 mg daily, ketorolac 10 mg 4 times a day as needed for pain, pantoprazole 40 mg 2 times a day.   DIET: Low-sodium, low-fat, low-cholesterol, ADA diet.   ACTIVITY: As tolerated. Sling on the left arm and not to use the right arm until approved by your orthopedic doctor.  FOLLOWUP: Please follow up with PCP within 1 to 2 weeks. Please follow up with orthopedic doctor for your arm fractures within a week. Follow up with Dr. Gustavo Lah from GI within 1 to 2 weeks.   SIGNIFICANT LABORATORIES AND IMAGING: Initial sodium was 140, potassium 3.7, BUN 14, creatinine 1.02. Hemoglobin was 6.9. Of note, it was 10.5 on December 30, last hemoglobin 10.7. Hemoglobin after 1 unit of blood was 11.1. Urinalysis did not suggest  infection. Vitamin B12 was 559. Initial x-ray of the chest, PA and lateral, for chest pain: No acute cardiopulmonary disease.  HISTORY OF PRESENT ILLNESS AND HOSPITAL COURSE: For full details of H and P, please see the dictation on December 31 by Dr. Lavetta Nielsen but briefly, this is a 49 year old obese female with history of schizophrenia, bipolar disorder, history of gastric bypass, comes in for chest pain. She was admitted to the hospitalist service for the pain and was also noted to have acute on chronic anemia with a hemoglobin in the 6's. Of note, she was just recently discharged a few days prior with hemoglobin in the 10s. She was admitted to the hospitalist service and did get a unit of blood for possible symptomatic anemia. However, the next day the hemoglobin had jumped up to 11. I suspect that the initial hemoglobin of the 6's was likely a lab error, as there is likely no chance of hemoglobin jumping 4 points with 1 unit of blood. Her hemoglobin has been stable in the 11 since the 1st. In regards to the chest pain, she was ruled out for acute coronary syndrome with 4 negative troponins. She did have initial elevation of CK total at 13.2. However, the patient also did have some rhabdomyolysis with CK total of close to 4000 on admission. The patient was started on PPI and was seen by GI, nonetheless,  and she was seen by Dr. Gustavo Lah. GI also believed that this was likely a lab error and no acute GI bleed. The patient does have a history of gastric bypass at [facility] in 2014. EGD is recommended possibly as an outpatient when clinically feasible, but I will hold on further work-up at this point. The patient has had no clinically evident GI bleed. In regards to the fracture of the upper extremities, she should follow with Dr. Rudene Christians as an outpatient.   PHYSICAL EXAMINATION: VITAL SIGNS: On the day of discharge, temperature is 98.2, pulse rate 76, respiratory rate 20, blood pressure 106/73, O2 sat 95%.  GENERAL: The  patient is an obese female lying in bed, no obvious distress, talking in full sentences.  HEENT: Normocephalic, atraumatic.  LUNGS: Clear. HEART SOUNDS: S1, S2,  ABDOMEN: Soft, nontender.  EXTREMITIES: No pitting edema.   At this point with stable hemoglobins, no active chest pains and chronic pain, she will be discharged with her chronic pain medications. Her rhabdomyolysis has significantly improved.   TOTAL TIME SPENT: 35 minutes.   CODE STATUS: The patient is full code.   ____________________________ Vivien Presto, MD sa:jcm D: 03/23/2013 17:28:13 ET T: 03/23/2013 18:24:10 ET JOB#: [id number]  cc: Vivien Presto, MD, <Dictator> Judsonia [place], FNP Vivien Presto MD ELECTRONICALLY SIGNED 04/02/2013 11:29

## 2014-07-12 NOTE — Consult Note (Signed)
PATIENT NAME:  [name], [name] MR#:  [id number] DATE OF BIRTH:  October 28, 1965  DATE OF CONSULTATION:  03/22/2013  REQUESTING PHYSICIAN: Dr. Berline Lopes GASTROENTEROLOGIST: Dr. Lucilla Lame.     PRIMARY CARE PHYSICIAN: Dr. Dessa Phi. White.  SURGEON:  Quillian Quince for a previous gastric bypass in February.   REASON FOR CONSULTATION: Anemia and chest pain.   HISTORY OF PRESENT ILLNESS: [name] is a 49 year old black female who was actually discharged from the hospital December 29 for hypoglycemic episodes and a fall. She presented 2 days later with chest pain and anemia. She is status post gastric bypass February 2014 at [facility] but cannot remember her surgeon's name. Upon admission to the hospital, she was found to have a hemoglobin of 6.9; however, she was given a unit of packed RBCs and hemoglobin went up to 11.3. It is 10.7 this morning. She was started on Protonix drip. She was found to be Hemoccult negative. She denies any NSAID use. She did have a recent cardiac work-up which showed a normal ejection fraction, and negative troponins. She has has a total CK of 3283 and CK-MB of 7. She has had a total of 5 negative troponins. She does have some heartburn and is not on PPI at home. She has no history of GERD.  She reports an EGD prior to her gastric bypass, which was normal. She states she has never had a colonoscopy. She denies any rectal bleeding or melena. She has had an intentional 40 pounds weight loss since her gastric bypass surgery in February. She is having a couple of loose stools daily since her bypass as well but less than 4. She denies any constipation. Her appetite has been fine.   PAST MEDICAL AND SURGICAL HISTORY: Gastric bypass in February 2014, schizophrenia, diabetes mellitus, chronic back pain, depression, anxiety, bipolar disorder, osteoarthritis, bilateral shoulder fractures, hypertension, morbid obesity, total knee replacement, partial hysterectomy and cholecystectomy.    MEDICATIONS PRIOR TO ADMISSION: Aspirin 81 mg daily, atorvastatin 80 mg at bedtime, clonazepam 0.5 mg at bedtime, Dilaudid 4 mg q. 6 hours p.r.n., etodolac 500 mg extended-release daily, furosemide 20 mg daily, gabapentin 800 mg q.i.d. Ketorolac 10 mg q.i.d. p.r.n., Percocet 7.5/325 mg q. 6 hours p.r.n., promethazine 25 mg q. 4 hours p.r.n., Seroquel 400 mg 2 tablets q. evening, sertraline 100 mg 2 tablets daily, tizanidine 4 mg 1 to 2 tablets q. 4 to 6 hours p.r.n. muscle spasms, tramadol 50 mg q. 6 hours p.r.n., vitamin D3 1000 international units 2 tablets daily.   ALLERGIES: MORPHINE CAUSES RASH, PENICILLIN ANAPHYLAXIS. REGLAN HEADACHES.   FAMILY HISTORY: There is no known family history for colorectal carcinoma, liver or chronic GI problems.   SOCIAL HISTORY: She denies any tobacco, alcohol or illicit drug use. She is disabled. She is married and has a son.   REVIEW OF SYSTEMS:  She has bilateral shoulder pain.  CONSTITUTIONAL: She has had weakness and fatigue.  ENDOCRINE:  She has had multiple episodes of hypoglycemia and has been evaluated by endocrinology.  PSYCHOSOCIAL:  She tells me she moved recently for [facility].  She has very little social support.  Transportation to physician visits seems to be an issue.   PHYSICAL EXAMINATION: VITAL SIGNS: Temperature 98.1, pulse 96, respirations 18, blood pressure 164/96. O2 sat 97% on room air.  GENERAL: She is an obese black female who is alert, oriented, pleasant, cooperative, in no acute distress.  HEENT: Sclerae clear.  She has a left eye conjunctival hemorrhage.  Oropharynx is pink and moist. She does have left lower lip ulcers.  NECK: Supple without any mass or thyromegaly.  CHEST: Heart regular rhythm. Normal S1, S2. No murmurs, rubs or gallops.  LUNGS: Clear to auscultation bilaterally.  ABDOMEN: Protuberant with positive bowel sounds x 4. No bruits auscultated. Soft, nontender nondistended, without palpable mass or hepatosplenomegaly.  No rebound tenderness or guarding.  Examination is limited given the patient's body habitus.  EXTREMITIES: Without clubbing or edema.  SKIN: Warm and dry without rash or jaundice.  PSYCHIATRIC: The patient is irritable and does not want to be discharged from the hospital. She is not combative, however.  MUSCULOSKELETAL: Good equal strength and movement bilaterally.  NEUROLOGIC: Grossly intact.   LABORATORY STUDIES: Chloride 108, magnesium 1.8, alcohol is negative. TSH normal urine drug screen positive for opiates and tricyclic antidepressants.   IMPRESSION:  [name] is a 49 year old black female admitted with anemia and chest pain just one day after being discharged from [facility] for hypoglycemia  and fall. Her hemoglobin was 6.9; however, I feel this was a lab error as she has received a unit of packed RBCs and hemoglobin was back up to 11 range. It was in 10 range at discharge. Nonetheless, she has normocytic anemia and is 10 months status post gastric bypass at [facility].  She is having chest pain and heartburn and will need EGD to look for anastomotic ulcer or erosions. Carafate and proton pump inhibitor are the treatments for above, so she is already empirically covered. Would recommend pursuing colonoscopy at the same time which can be done outpatient as well. She was Hemoccult negative. I have discussed her care with Dr. Vianne Bulls and Dr. Lucilla Lame.   PLAN: 1.  Agree with PPI.  2.  Since she has had breakfast this morning, EGD cannot be done today. The patient is upset with this, however, we explained it is due to her own safety and she is at high risk for aspiration and anesthesia will not place her to sleep or sedate her with a full stomach. Her EGD can be done outpatient. If she is unable to be discharged home over the weekend, EGD and colonoscopy can be set up for Monday with Dr. Allen Norris.  3.  Anemia panel.  4.  Carafate 1 gram q.i.d.   Thank you for allowing  [place] to participate in the care of [name].    ____________________________ Andria Meuse, NP klj:dp D: 03/22/2013 10:48:22 ET T: 03/22/2013 11:40:01 ET JOB#: [id number]  cc: Andria Meuse, NP, <Dictator> Dani Gobble. White, FNP  Brennan Bailey Evalina Field FNP ELECTRONICALLY SIGNED 04/03/2013 20:25

## 2014-07-12 NOTE — H&P (Signed)
PATIENT NAME:  [name], [name] MR#:  [id number] DATE OF BIRTH:  03-17-1966  DATE OF ADMISSION:  03/20/2013  REFERRING PHYSICIAN: Dr. Reita Cliche.   PRIMARY CARE PHYSICIAN: Dr. Dema Severin.   CHIEF COMPLAINT: Chest pain.   HISTORY OF PRESENT ILLNESS: This is a 49 year old African American female with a past medical history of type 2 diabetes noninsulin-requiring, schizophrenia, hypertension, gastric bypass who is presenting with chest pain. She describes a 1 day duration of chest pain over her left chest. It is sharp and stabbing in nature, rating it 100 out of 10 in intensity. No worsening or relieving factors. She describes it as a constant pain, radiating down her left arm with associated shortness of breath and nausea. Her symptoms improved after receiving Dilaudid in the Emergency Department, currently rating her pain 8 out of 10 while resting comfortably in bed. On routine lab work, she was found to be anemic in the 6's when compared to 10 from the day before. She denies any recent bleeding, any change in bowel habits. Denies any constipation. No melena, hematochezia or hematemesis. She no longer has her menstrual period given partial hysterectomy. She also denotes having 2 recent syncopal episodes with bilateral humeral fractures, both diagnosed within the last week. Currently complaining of chest pain, though markedly improved from baseline.   REVIEW OF SYSTEMS:  CONSTITUTIONAL: Denies fever, fatigue, weakness.  EYES: Denies blurred vision, double vision, eye pain.  EARS, NOSE, THROAT: Denies tinnitus, ear pain, hearing loss.  RESPIRATORY: Denies cough, wheeze. Positive for shortness of breath.  CARDIOVASCULAR: Positive for chest pain; however, denies any orthopnea, edema, palpitations.  GASTROINTESTINAL: Positive for nausea; however, denies any vomiting, diarrhea, abdominal pain, hematemesis, melena.  GENITOURINARY: Denies dysuria, hematuria.  ENDOCRINE: Denies nocturia or thyroid problems.  HEMATOLOGIC  AND LYMPHATIC: Denies easy bruising or bleeding.  SKIN: Denies rashes or lesions.  MUSCULOSKELETAL: Positive for pain in back and shoulders; however, denies any neck pain knee pain, hip or arthritic symptoms.  NEUROLOGIC: Denies paralysis, paresthesias.  PSYCHIATRIC: Denies anxiety or depressive symptoms.   Otherwise, full review of systems performed by me is negative.   PAST MEDICAL HISTORY: Hypertension, chronic pain, chronic lumbago, morbid obesity, depression, anxiety not otherwise specified, schizophrenia, bipolar disorder, osteoarthritis, diabetes noninsulin requiring, recent bilateral humeral fractures.   PAST SURGICAL HISTORY: Gastric bypass, total knee replacement, partial hysterectomy and cholecystectomy.   FAMILY HISTORY: Positive for diabetes; however, denies any known cardiac disease or sudden cardiac death.   SOCIAL HISTORY: Denies any alcohol, tobacco or drug usage.   ALLERGIES: MORPHINE, PENICILLIN AND REGLAN. She says these cause a burning sensation.   HOME MEDICATIONS: Include aspirin 81 mg p.o. daily, atorvastatin 80 mg p.o. daily, clonazepam 0.5 mg p.o. at bedtime, Dilaudid 4 mg p.o. q.6 hours as needed for pain, etodolac extended release 500 mg p.o. daily, Lasix 20 mg p.o. daily, gabapentin 800 mg p.o. 4 times daily, ketorolac 10 mg p.o. 4 times daily as needed for pain, Percocet 7.5/325 mg p.o. q.6 hours as needed for pain, promethazine 25 mg p.o. q.4 hours as needed for nausea, Seroquel extended release 400 mg 2 tablets p.o. daily, sertraline 100 mg 2 tablets p.o. daily, tizanidine 4 mg every 6 to 8 hours as needed for muscle spasm, tramadol 50 mg p.o. q.6 hours as needed for pain, vitamin D3 1000 international units 2 tablets daily.   PHYSICAL EXAMINATION:  VITAL SIGNS: Temperature 98.1, heart rate 97, respirations 18, blood pressure 127/74, saturating 93% on room air. Weight 110.2 kg, BMI 43.1.  GENERAL: Obese African American female who is currently in no acute  distress.  HEAD: Normocephalic, atraumatic.  EYES: Pupils equal, round and reactive to light and accommodation. Extraocular muscles intact. No scleral icterus; however, there is subconjunctival hemorrhage of the left eye.  MOUTH: Moist mucous membranes. Dentition intact. No abscesses noted; however, there are excoriations over the lips upper and lower.  EAR, NOSE, THROAT: Throat clear without exudates. No external lesions.  NECK: Supple. No thyromegaly. No nodules. No JVD.  PULMONARY: Clear to auscultation bilaterally. No wheezes or rhonchi. No use of accessory muscles. Good respiratory effort.  CHEST: Tender to palpation over the left chest which reproduces similar symptoms.  CARDIOVASCULAR: S1, S2, regular rate and rhythm. No murmurs, rubs or gallops. No edema. Pedal pulses 2+ bilaterally.  GASTROINTESTINAL: Soft, nontender, nondistended. No masses. Positive bowel sounds. No hepatosplenomegaly.  MUSCULOSKELETAL: No swelling, clubbing or edema. Range of motion in upper extremities limited secondary to pain.  NEUROLOGIC: Cranial nerves II through XII intact. No gross focal neurological deficits. Sensation intact. Reflexes intact.  SKIN: Excoriations over her lips as described above; however, no further ulcerations, lesions, rash or cyanosis. Skin warm, dry. Turgor is intact.  PSYCHIATRIC: Mood and affect within normal limits. The patient is awake, alert and oriented x 3. Insight and judgment intact.   LABORATORY DATA: Sodium 140, potassium 3.7, chloride 108, bicarb 23, BUN 14, creatinine 1.02, glucose 66, calcium 8.2. Troponin I less than 0.02. WBC 5.2, hemoglobin 6.9, MCV of 92, RDW of 14.1, platelets of 226. Urinalysis negative for evidence of infection. EKG performed. Normal sinus rhythm, heart rate 95. No ST or T wave abnormalities. QT prolongation. QTc of 490. Recent CT chest performed within the last week for ruling out of pulmonary embolism which was negative for any acute findings.    ASSESSMENT AND PLAN: A 49 year old [place] American female with a past medical history of diabetes, schizophrenia, hypertension, gastric bypass who is presenting with chest pain, found to be anemic compared to labs that were performed just yesterday.  1. Chest pain: Admit to telemetry. Trend cardiac enzymes. She was given a statin. Will hold her aspirin given acute bleeding. This is likely in relation to demand ischemia and should improve after receiving blood; however, there is also compounded by musculoskeletal given her recent fractures.  2. Acute symptomatic anemia: Transfuse 1 unit of packed red blood cells. Trend CBC q.6 hours. Currently, there is no clear etiology at this time; however, she has been started on a Protonix drip given history of gastric bypass; however, she is heme negative on exam. Will have to continue this as started by the Emergency Department for 24 hours and then stop if there are no further symptoms.  3. Recurrent syncope with mildly prolonged QTc, with no conduction delays noted on EKG. Will monitor on telemetry and check magnesium as well as TSH.  4. Type 2 diabetes: Continue to hold Januvia which was held on her last admission secondary to hypoglycemia.  5. Venous thromboembolism prophylaxis with sequential compression devices only. Avoid heparin given chest pain and acute symptomatic anemia.   The patient is FULL CODE.   TIME SPENT: 45 minutes.   ____________________________ Aaron Mose. Hower, MD dkh:gb D: 03/20/2013 20:53:07 ET T: 03/20/2013 21:11:18 ET JOB#: [id number]  cc: Aaron Mose. Hower, MD, <Dictator> DAVID Woodfin Ganja MD ELECTRONICALLY SIGNED 03/21/2013 2:26

## 2014-07-17 IMAGING — CT CT HEAD WITHOUT CONTRAST
1 series · 16 of 30 positions shown, 20 images · non-contrast
Comparison: none

REASON FOR EXAM: fall with LOC this AM
COMMENTS:   LMP: Post Hysterectomy

PROCEDURE:     CT  - CT HEAD WITHOUT CONTRAST  - December 08, 2012 [DATE]
RESULT:     Comparison:  None
TECHNIQUE: Multiple axial images from the foramen magnum to the vertex were
obtained without IV contrast.

[Series 2: soft tissue · axial · 0.42mm/px · z∈[+164,+299]mm · 16 of 30 slices shown, 20 images]
[im 2/30  brain]
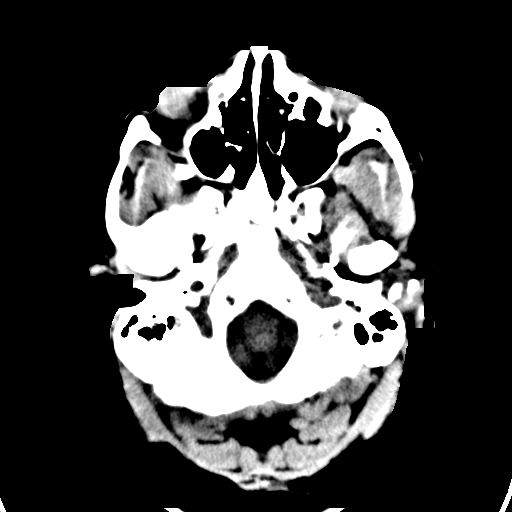
[im 2/30  bone]
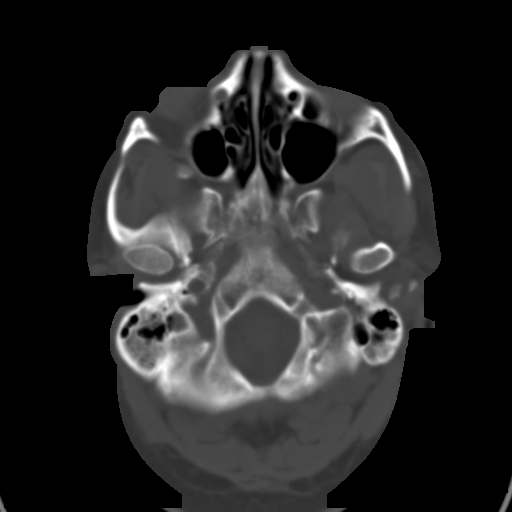
[im 4/30  brain]
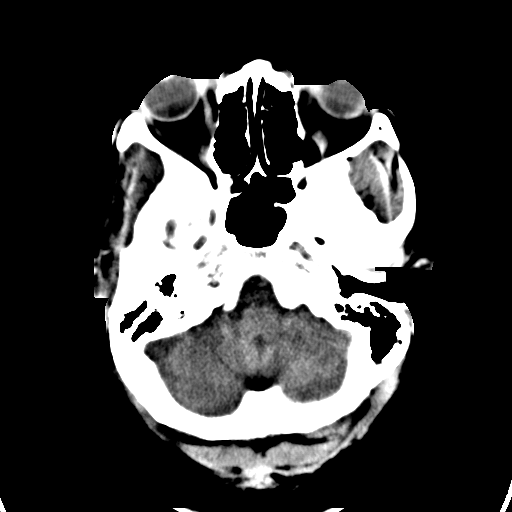
[im 6/30  brain]
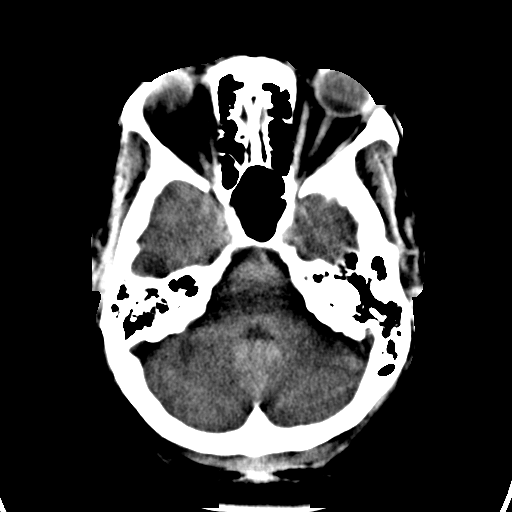
[im 8/30  brain]
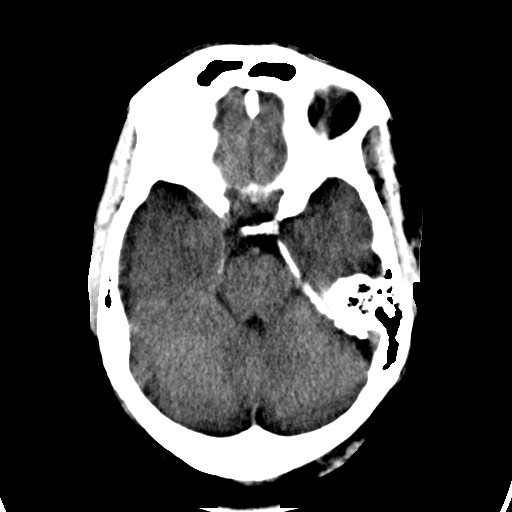
[im 9/30  brain]
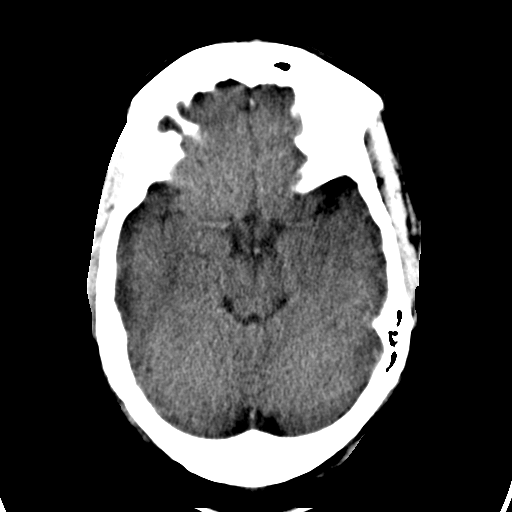
[im 9/30  bone]
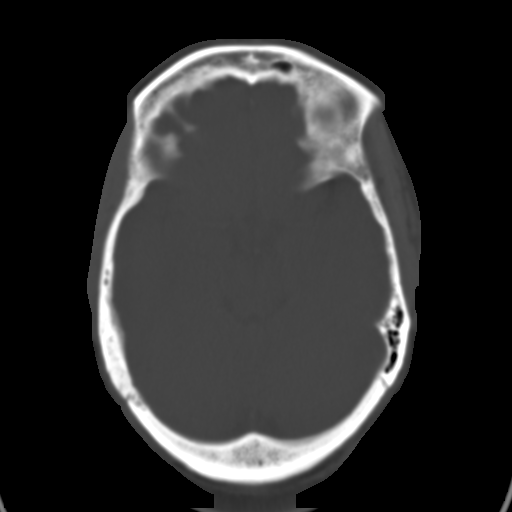
[im 11/30  brain]
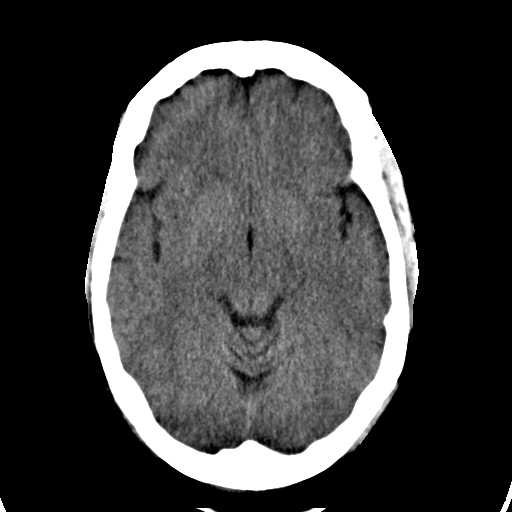
[im 13/30  brain]
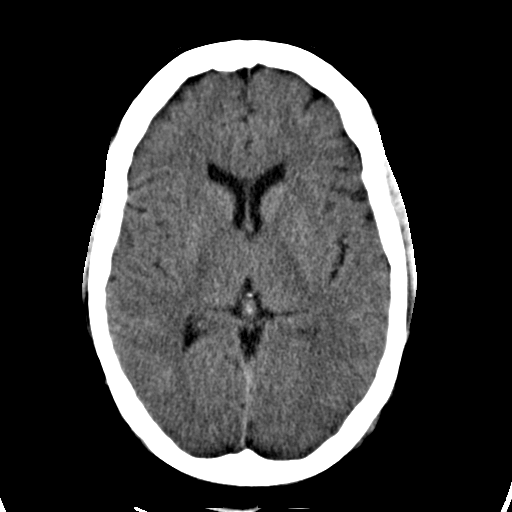
[im 15/30  brain]
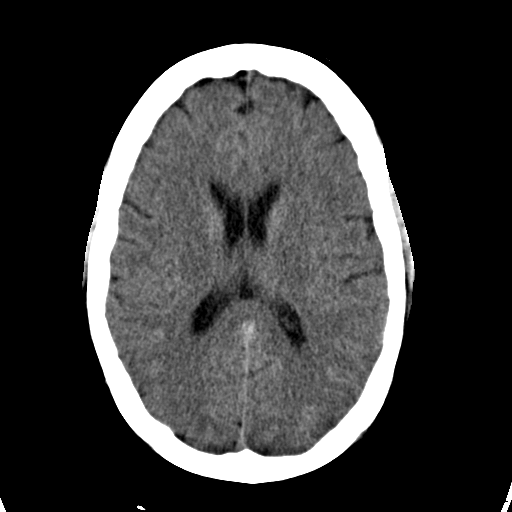
[im 16/30  brain]
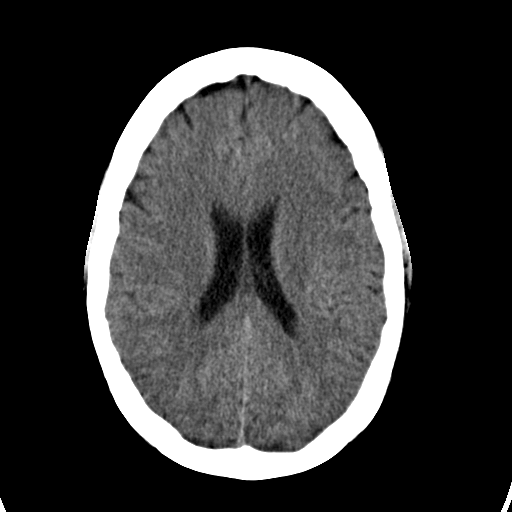
[im 16/30  bone]
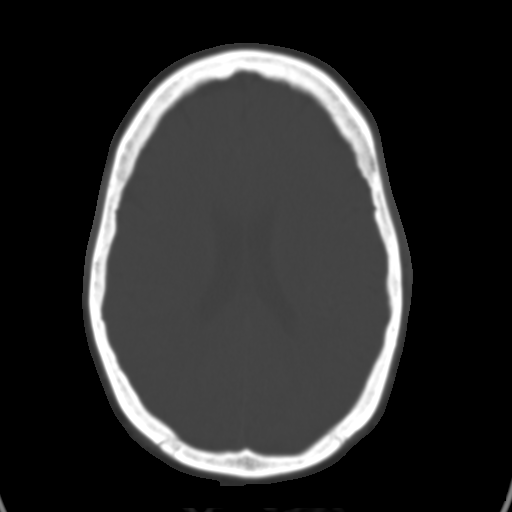
[im 18/30  brain]
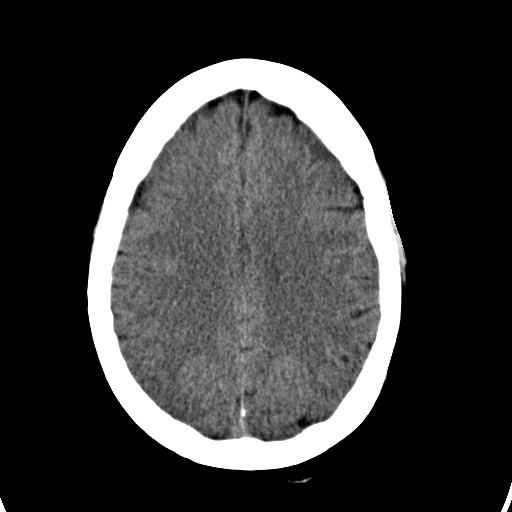
[im 20/30  brain]
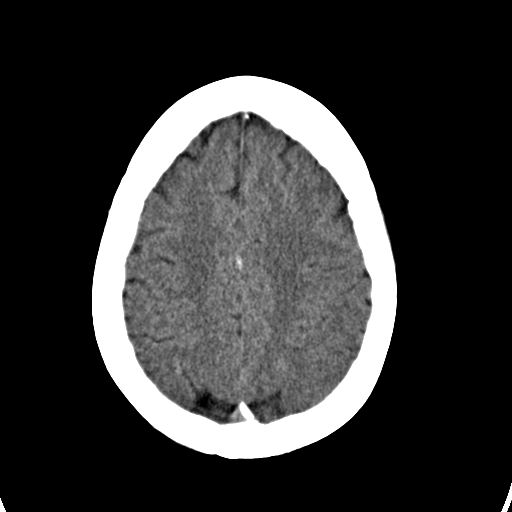
[im 22/30  brain]
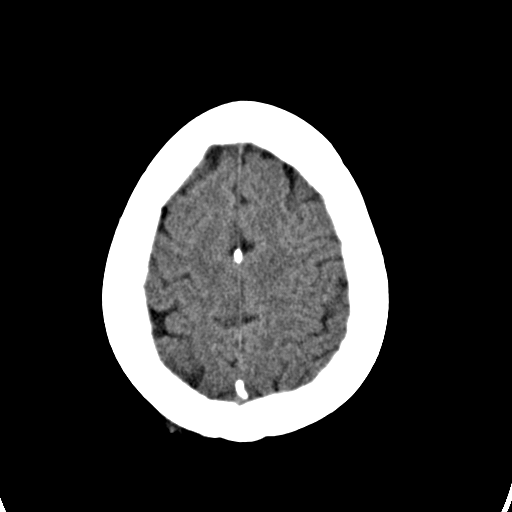
[im 23/30  brain]
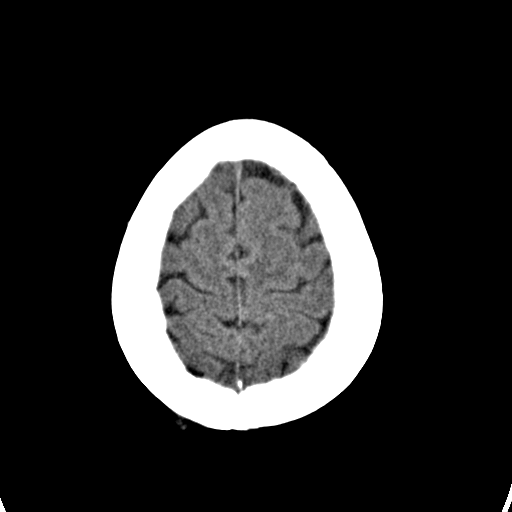
[im 23/30  bone]
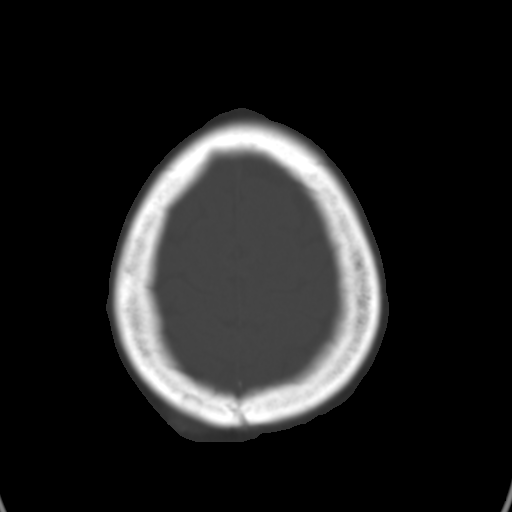
[im 25/30  brain]
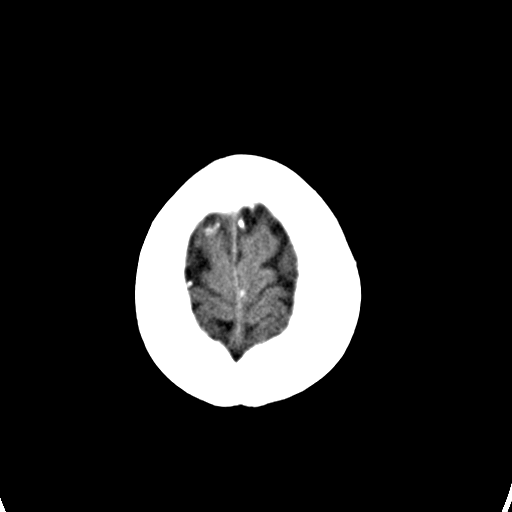
[im 27/30  brain]
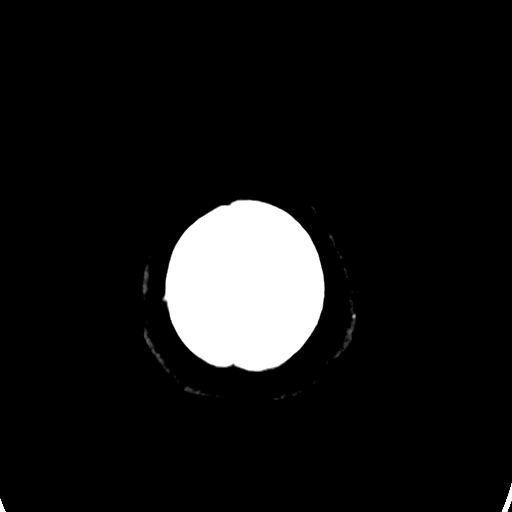
[im 29/30  brain]
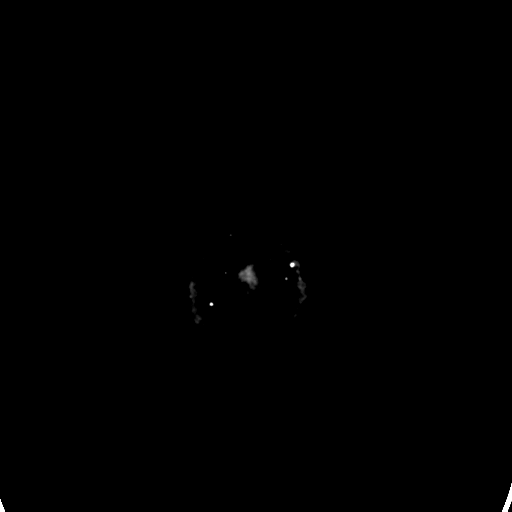

[16 of 30 positions shown; findings below may reference images not displayed]

FINDINGS: There is no evidence of mass effect, midline shift, or extra-axial fluid
collections.  There is no evidence of a space-occupying lesion or
intracranial hemorrhage. There is no evidence of a cortical-based area of
acute infarction.

The ventricles and sulci are appropriate for the patient's age. The basal
cisterns are patent.

Visualized portions of the orbits are unremarkable. The visualized portions
of the paranasal sinuses and mastoid air cells are unremarkable.

The osseous structures are unremarkable.
IMPRESSION: No acute intracranial process.

[REDACTED]

## 2014-07-20 NOTE — H&P (Signed)
PATIENT NAME:  [name], [name] MR#:  [id number] DATE OF BIRTH:  11-19-1965  DATE OF ADMISSION:  04/21/2014  PRIMARY CARE PHYSICIAN:  [facility].    REQUESTING PHYSICIAN:  Dr. Beather Arbour.    CHIEF COMPLAINT:  Seizure and hypoglycemia.   HISTORY OF PRESENT ILLNESS: The patient is a 49 year old female with a known history of chronic pain, schizoaffective disorder, was admitted for seizure thought to be due to hypoglycemia.  The patient went to bed around 9:00 last night after eating her food,  her sugar was around 541 around 10:00 p.m. Around 3:20 a.m. she woke up with gargled breathing, her husband was at the bedside, noted some jerking on the left side, checked her blood sugar, it was reported to be 50, called EMS who rechecked her sugar, it was around 62 and give her glucagon with a repeated blood sugar of around 100.  The husband reports the patient took 10 units of insulin Novolin after each meal as she normally takes, she did take her 60 units of Levemir in the morning also yesterday.  While in the ED she has been looking normal, ER does not feel comfortable discharging the patient home and is being admitted for observation.   PAST MEDICAL HISTORY:   1. Hypertension.  2. Chronic pain.  3. Chronic lumbago.   4. Morbid obesity.  5. Depression.  6. Anxiety.  7. Schizophrenia.   8. Bipolar disorder.   9. Osteoarthritis.  10. Diabetes.  11. History of bilateral humerus fracture.   PAST SURGICAL HISTORY:  1. Gastric bypass.  2. Total knee replacement.  3. Partial hysterectomy.  4. Cholecystectomy.   FAMILY HISTORY:  Positive for diabetes.   SOCIAL HISTORY: No tobacco, alcohol, or drug use.   ALLERGIES: MORPHINE, PENICILLIN, AND REGLAN.    MEDICATIONS AT HOME:  1. Vitamin D3, 5000 international units once daily.  2. Aspirin 81 mg p.o. daily.  3. Lasix 20 mg p.o. daily.  4. Gabapentin 800 mg p.o. 4 times a day.  5. Insulin Levemir 60 units subcutaneous daily.  6. Insulin NovoLog  FlexPen  10 units subcutaneous 3 times a day with each meal.   7. Promethazine 25 mg p.o. every 4 hours as needed.  8. Seroquel XR 400 mg 2 tablets p.o. daily.  9. Sertraline 100 mg p.o. b.i.d.  10. Tizanidine 4 mg 1-2 tablets p.o. every 6-8 hours as needed.   REVIEW OF SYSTEMS:    CONSTITUTIONAL: No fever. Positive fatigue and weakness.  EYES: No blurred or double vision.  EARS, NOSE, AND THROAT: No tinnitus or ear pain.  RESPIRATORY: No cough or hemoptysis.  CARDIOVASCULAR: No chest pain, orthopnea, edema.  GASTROINTESTINAL: No nausea, vomiting, diarrhea.   GENITOURINARY:  No dysuria or hematuria.  ENDOCRINE: No polyuria, nocturia.   HEMATOLOGY:  No anemia, easy bruising.    SKIN: No rash or lesion.  MUSCULOSKELETAL: Positive for back pain and shoulder pain. She does have a history of chronic pain, is waiting to get appointment with [facility] pain clinic.  NEUROLOGIC: Had some jerking episode during night worrisome for seizure, likely due to hypoglycemia, back to normal now.   PSYCHIATRIC:  History of schizoaffective disorder and depression.  PHYSICAL EXAMINATION:  VITAL SIGNS: Temperature 98.4, heart rate 84 per minute, respirations 18 per minute, blood pressure 164/82, she is saturating 100% on room air.  GENERAL:  The patient is a 49 year old female lying in the bed comfortably, without any acute distress.  EYES: Pupils equal, round, reactive to light and  accommodation. No scleral icterus. Extraocular muscles intact.  HEENT:  Head atraumatic, normocephalic. Oropharynx and nasopharynx clear.  NECK: Supple. No jugular venous distention. No thyroid enlargement or tenderness.  LUNGS: Clear to auscultation bilaterally.  No wheezing, rales. No crepitation.  CARDIOVASCULAR: S1, S2 normal. No murmurs.  ABDOMEN: Soft, obese, nontender, nondistended. Bowel sounds present. No organomegaly or mass.  EXTREMITIES: No pedal edema, cyanosis, clubbing.  NEUROLOGIC: Cranial nerves II through XII intact.  Muscle strength 5 out of 5 in extremities. Sensation intact.  PSYCHIATRIC: The patient is alert and oriented x 3.  SKIN: No obvious rash, lesion, or ulcer.  MUSCULOSKELETAL: She does have some tenderness in her back muscles.   LABORATORY DATA:  1.  Normal BMP.  2.  Normal first set of troponin.  3.  Normal liver function tests except alkaline phosphatase 119.  4.  Normal CBC except hemoglobin 11.9.  5.  Normal coagulation panel.  6.  Negative UA.  7.  Urine toxicology was positive for tricyclic antidepressant.     CT scan of the head without contrast in the ED showed no acute intracranial process.   IMPRESSION AND PLAN:  1.  Seizure, likely hypoglycemia-related, doubt need for any epileptic drugs. We will consult neurology for further evaluation.  2.  Brittle diabetes. We will consult diabetic educator, check hemoglobin A1c, monitor her sugar q. 2 for now. Her sugars seem to be good now.  3.  Chronic pain. We will continue home medication, tramadol. Have strictly advised her that I will not be giving any narcotics pain medication which seem to be her biggest issue here at this point as she has already been asking for Dilaudid which I have refused. She has also been denied for narcotics from her primary care physician, for which she has been referred to [facility], I think first appointment will be in April, so she has been begging for narcotics which I have again refused and she is not happy about that.  4.  Recurrent falls, likely multifactorial. We will get physical and occupational therapy evaluation and management.  5.  Code status is full code.   TOTAL TIME TAKING CARE OF THIS PATIENT: 45 minutes.    ____________________________ Lucina Mellow. Manuella Ghazi, MD vss:bu D: 04/21/2014 13:09:56 ET T: 04/21/2014 13:53:57 ET JOB#: [id number]  cc: Osha Errico S. Manuella Ghazi, MD, <Dictator> [facility] [place] MD ELECTRONICALLY SIGNED 04/22/2014 10:09

## 2014-07-20 NOTE — Consult Note (Signed)
PATIENT NAME:  [name], [name] MR#:  [id number] DATE OF BIRTH:  October 29, 1965  DATE OF CONSULTATION:  04/21/2014  REASON FOR CONSULTATION: Seizure activity.   HISTORY OF PRESENT ILLNESS: A 49 year old female with diabetes, presenting with hypoglycemia. The patient has also chronic back pain radiating into bilateral lower extremities, on tramadol at home. Presented with seizure-like episode, generalized tonic-clonic seizure that self resolved. It is a first-time seizure. The patient is currently back to baseline. No current complaints, besides the chronic pain that she is having, and she is asking for Dilaudid.   Laboratory workup reviewed. Imaging reviewed. Past medical history and family history reviewed.   REVIEW OF SYSTEMS: No shortness of breath or chest pain. No abdominal pain. Positive for chronic back pain. No heat or cold intolerance. No new weakness on one side of the body compared to the other. Facial sensation intact. Facial motor is intact. Tongue is midline. Uvula elevates symmetrically. Shoulder shrug intact. Motor strength 5/5 bilateral upper extremities 4/5 bilateral lower extremities due to chronic pain.  IMPRESSION: A 49 year old female with history of diabetes, admitted with episode of hypoglycemia and seizure-like episode, back to baseline.   PLAN: No antiepileptics. The patient is asking for pain medications, specifically IV for chronic back pain. Please have patient follow up at [facility]. That is who sees her for pain management. No need for EEG or any further imaging at this point. Likely provoked by hypoglycemia. Discharge planning. Thank you. It was a pleasure seeing this patient.  ____________________________ Leotis Pain, MD yz:ap D: 04/21/2014 12:48:41 ET T: 04/21/2014 13:22:02 ET JOB#: [id number]  cc: Leotis Pain, MD, <Dictator> Leotis Pain MD ELECTRONICALLY SIGNED 04/29/2014 12:08

## 2014-07-20 NOTE — Consult Note (Signed)
PATIENT NAME:  [name], [name] MR#:  [id number] DATE OF BIRTH:  [birth year]  DATE OF CONSULTATION:  04/21/2014  REFERRING PHYSICIAN:   CONSULTING PHYSICIAN:  [name] S. Dana Ghazi, MD  PRIMARY Buck PHYSICIAN:  [name].    REQUESTING PHYSICIAN:  Dr. Beather Buck.    CHIEF COMPLAINT:  Seizure and hypoglycemia.   HISTORY OF PRESENT ILLNESS: The patient is a 49 year old female with a known history of chronic pain, schizoaffective disorder, was admitted for seizure thought to be due to hypoglycemia.  The patient went to bed around 9:00 last night after eating her food,  her sugar was around 541 around 10:00 p.m. Around 3:20 a.m. she woke up with gargled breathing, her husband was at the bedside, noted some jerking on the left side, checked her blood sugar, it was reported to be 50, called EMS who rechecked her sugar, it was around 62 and give her glucagon with a repeated blood sugar of around 100.  The husband reports the patient took 10 units of insulin Novolin after each meal as she normally takes, she did take her 60 units of Levemir in the morning also yesterday.  While in the ED she has been looking normal, ER does not feel comfortable discharging the patient home and is being admitted for observation.   PAST MEDICAL HISTORY:   1. Hypertension.  2. Chronic pain.  3. Chronic lumbago.   4. Morbid obesity.  5. Depression.  6. Anxiety.  7. Schizophrenia.   8. Bipolar disorder.   9. Osteoarthritis.  10. Diabetes.  11. History of bilateral humerus fracture.   PAST SURGICAL HISTORY:  1. Gastric bypass.  2. Total knee replacement.  3. Partial hysterectomy.  4. Cholecystectomy.   FAMILY HISTORY:  Positive for diabetes.   SOCIAL HISTORY: No tobacco, alcohol, or drug use.   ALLERGIES: MORPHINE, PENICILLIN, AND REGLAN.    MEDICATIONS AT HOME:  1. Vitamin D3, 5000 international units once daily.  2. Aspirin 81 mg p.o. daily.  3. Lasix 20 mg p.o. daily.  4. Gabapentin 800 mg p.o. 4 times a day.   5. Insulin Levemir 60 units subcutaneous daily.  6. Insulin NovoLog FlexPen  10 units subcutaneous 3 times a day with each meal.   7. Promethazine 25 mg p.o. every 4 hours as needed.  8. Seroquel XR 400 mg 2 tablets p.o. daily.  9. Sertraline 100 mg p.o. b.i.d.  10. Tizanidine 4 mg 1-2 tablets p.o. every 6-8 hours as needed.   REVIEW OF SYSTEMS:    CONSTITUTIONAL: No fever. Positive fatigue and weakness.  EYES: No blurred or double vision.  EARS, NOSE, AND THROAT: No tinnitus or ear pain.  RESPIRATORY: No cough or hemoptysis.  CARDIOVASCULAR: No chest pain, orthopnea, edema.  GASTROINTESTINAL: No nausea, vomiting, diarrhea.   GENITOURINARY:  No dysuria or hematuria.  ENDOCRINE: No polyuria, nocturia.   HEMATOLOGY:  No anemia, easy bruising.    SKIN: No rash or lesion.  MUSCULOSKELETAL: Positive for back pain and shoulder pain. She does have a history of chronic pain, is waiting to get appointment with [facility] pain clinic.  NEUROLOGIC: Had some jerking episode during night worrisome for seizure, likely due to hypoglycemia, back to normal now.   PSYCHIATRIC:  History of schizoaffective disorder and depression.  PHYSICAL EXAMINATION:  VITAL SIGNS: Temperature 98.4, heart rate 84 per minute, respirations 18 per minute, blood pressure 164/82, she is saturating 100% on room air.  GENERAL:  The patient is a 49 year old female lying in the bed comfortably, without  any acute distress.  EYES: Pupils equal, round, reactive to light and accommodation. No scleral icterus. Extraocular muscles intact.  HEENT:  Head atraumatic, normocephalic. Oropharynx and nasopharynx clear.  NECK: Supple. No jugular venous distention. No thyroid enlargement or tenderness.  LUNGS: Clear to auscultation bilaterally.  No wheezing, rales. No crepitation.  CARDIOVASCULAR: S1, S2 normal. No murmurs.  ABDOMEN: Soft, obese, nontender, nondistended. Bowel sounds present. No organomegaly or mass.  EXTREMITIES: No pedal edema,  cyanosis, clubbing.  NEUROLOGIC: Cranial nerves II through XII intact. Muscle strength 5 out of 5 in extremities. Sensation intact.  PSYCHIATRIC: The patient is alert and oriented x 3.  SKIN: No obvious rash, lesion, or ulcer.  MUSCULOSKELETAL: She does have some tenderness in her back muscles.   LABORATORY DATA:  1.  Normal BMP.  2.  Normal first set of troponin.  3.  Normal liver function tests except alkaline phosphatase 119.  4.  Normal CBC except hemoglobin 11.9.  5.  Normal coagulation panel.  6.  Negative UA.  7.  Urine toxicology was positive for tricyclic antidepressant.     CT scan of the head without contrast in the ED showed no acute intracranial process.   IMPRESSION AND PLAN:  1.  Seizure, likely hypoglycemia-related, doubt need for any epileptic drugs. We will consult neurology for further evaluation.  2.  Brittle diabetes. We will consult diabetic educator, check hemoglobin A1c, monitor her sugar q. 2 for now. Her sugars seem to be good now.  3.  Chronic pain. We will continue home medication, tramadol. Have strictly advised her that I will not be giving any narcotics pain medication which seem to be her biggest issue here at this point as she has already been (Dictation Anomaly)<<MISSING TEXT>> asking for Dilaudid which I have refused. She has also been denied for narcotics from her primary Buck physician, for which she has been referred to [facility], I think first appointment will be in April, so she has been begging for narcotics which I have again refused and she is not happy about that.  4.  Recurrent falls, likely multifactorial. We will get physical and occupational therapy evaluation and management.  5.  Code status is full code.   TOTAL TIME TAKING Buck OF THIS PATIENT: 45 minutes.    ____________________________ [name]. Dana Ghazi, MD vss:bu D: 04/21/2014 13:09:56 ET T: 04/21/2014 13:53:57 ET JOB#: [id number]  cc: [name] S. Dana Ghazi, MD, <Dictator> [name] [place]

## 2014-07-20 NOTE — Discharge Summary (Signed)
PATIENT NAME:  [name], BIRCHLER MR#:  [id number] DATE OF BIRTH:  Oct 23, 1965  DATE OF ADMISSION:  04/21/2014 DATE OF DISCHARGE:  04/22/2014  DISCHARGE DIAGNOSES:  1.  Hypoglycemia-induced seizure.  2.  Brittle diabetes mellitus.   SECONDARY DIAGNOSES:   1. Hypertension.  2. Chronic pain.  3. Chronic lumbago.   4. Morbid obesity.  5. Depression.  6. Anxiety.  7. Schizophrenia.   8. Bipolar disorder.   9. Osteoarthritis.  10. Diabetes.  11. History of bilateral humerus fracture.   CONSULTATIONS:  Neurology Leotis Pain, MD.     PROCEDURES AND RADIOLOGY:  CT scan of the head without contrast on February 1 showed no acute intracranial process.   MAJOR LABORATORY PANEL: UA on admission was negative.   Blood cultures x 2 were negative on February 1.   HISTORY AND SHORT HOSPITAL COURSE: The patient is a 49 year old female with the above-mentioned medical problems who was admitted for hypoglycemia-induced seizure. Please see Dr. Trena Platt dictated history and physical for further details. The patient was evaluated by neurology Dr. Leotis Pain who recommended antiepileptic medication as this was clearly induced by low blood sugar. The patient was feeling much better, did not have any hypoglycemia while in the hospital. She remained seizure-free and was feeling much better, was close to her baseline on February 2 and was discharged home in stable condition.   VITAL SIGNS: On the date of discharge her vital signs are as follows: Temperature 98.2, heart rate 90 per minute, respirations 18 per minute, blood pressure 120/76. She was saturating 96% on room air.    PERTINENT PHYSICAL EXAMINATION ON THE DATE OF DISCHARGE:  CARDIOVASCULAR: S1, S2 normal. No murmurs, rubs, or gallop.  LUNGS: Clear to auscultation bilaterally. No wheezing, rales, rhonchi, or crepitation.  ABDOMEN: Soft, benign.  NEUROLOGIC: Nonfocal examination.   All other physical examination remained at baseline.  DISCHARGE  MEDICATIONS:   Medication Instructions  furosemide 20 mg oral tablet  1 tab(s) orally once a day   gabapentin 800 mg oral tablet  1 tab(s) orally 4 times a day   promethazine 25 mg oral tablet  1 tab(s) orally every 4 hours, As Needed for nausea   tizanidine 4 mg oral tablet  1-2 tab(s) orally every 6 to 8 hours, As Needed for muscle spasms   seroquel xr 400 mg oral tablet, extended release  2 tabs (800 mg) orally once a day (in the evening)   aspirin enteric coated 81 mg oral delayed release tablet  1 tab(s) orally once a day   vitamin d3 5000 intl units oral tablet  1 tab(s) orally once a day   novolog flexpen 100 units/ml subcutaneous solution  10 unit(s) subcutaneous 3 times a day (with meals)   levemir flexpen 100 units/ml subcutaneous solution  60 unit(s) subcutaneous once a day (at bedtime)   sertraline 100 mg oral tablet  1 tab(s) orally 2 times a day    DISCHARGE DIET: Regular.   DISCHARGE ACTIVITY: As tolerated.   DISCHARGE INSTRUCTIONS AND FOLLOWUP:  The patient was instructed to follow up with her primary care physician, Eulogio Bear, FNP in 1-2 weeks.   TOTAL TIME DISCHARGING THIS PATIENT:   40 minutes.   Please note the patient remains at very high risk for readmission. There is possible narcotic abuse tendency with this patient, so be caution with narcotics.    ____________________________ Lucina Mellow. Manuella Ghazi, MD vss:bu D: 04/24/2014 17:07:53 ET T: 04/24/2014 17:17:07 ET JOB#: [id number]  cc: Kalib Bhagat S. Manuella Ghazi,  MD, <Dictator> Dani Gobble. [place], FNP Leotis Pain, MD  Remer Macho MD ELECTRONICALLY SIGNED 04/24/2014 17:49

## 2014-08-26 ENCOUNTER — Emergency Department
Admission: EM | Admit: 2014-08-26 | Discharge: 2014-08-27 | Disposition: A | Discharge status: Home / Self Care | Payer: Medicare Other | Attending: Emergency Medicine | Admitting: Emergency Medicine

## 2014-08-26 ENCOUNTER — Encounter: Payer: Self-pay | Admitting: Emergency Medicine

## 2014-08-26 ENCOUNTER — Emergency Department: Payer: Medicare Other

## 2014-08-26 DIAGNOSIS — R1032 Left lower quadrant pain: Secondary | ICD-10-CM | POA: Insufficient documentation

## 2014-08-26 DIAGNOSIS — F419 Anxiety disorder, unspecified: Secondary | ICD-10-CM | POA: Insufficient documentation

## 2014-08-26 DIAGNOSIS — E119 Type 2 diabetes mellitus without complications: Secondary | ICD-10-CM | POA: Diagnosis not present

## 2014-08-26 DIAGNOSIS — K439 Ventral hernia without obstruction or gangrene: Secondary | ICD-10-CM | POA: Diagnosis not present

## 2014-08-26 DIAGNOSIS — Z880 Allergy status to penicillin: Secondary | ICD-10-CM | POA: Diagnosis not present

## 2014-08-26 DIAGNOSIS — R079 Chest pain, unspecified: Secondary | ICD-10-CM | POA: Insufficient documentation

## 2014-08-26 HISTORY — DX: Type 2 diabetes mellitus without complications: E11.9

## 2014-08-26 HISTORY — DX: Pure hypercholesterolemia, unspecified: E78.00

## 2014-08-26 LAB — CBC WITH DIFFERENTIAL/PLATELET
BASOS ABS: 0.1 10*3/uL (ref 0–0.1)
Basophils Relative: 1 %
EOS ABS: 0.1 10*3/uL (ref 0–0.7)
Eosinophils Relative: 1 %
HCT: 39.1 % (ref 35.0–47.0)
HEMOGLOBIN: 12.5 g/dL (ref 12.0–16.0)
LYMPHS PCT: 47 %
Lymphs Abs: 6.4 10*3/uL — ABNORMAL HIGH (ref 1.0–3.6)
MCH: 27.4 pg (ref 26.0–34.0)
MCHC: 31.9 g/dL — AB (ref 32.0–36.0)
MCV: 85.8 fL (ref 80.0–100.0)
Monocytes Absolute: 0.7 10*3/uL (ref 0.2–0.9)
Monocytes Relative: 5 %
NEUTROS PCT: 46 %
Neutro Abs: 6.2 10*3/uL (ref 1.4–6.5)
Platelets: 457 10*3/uL — ABNORMAL HIGH (ref 150–440)
RBC: 4.56 MIL/uL (ref 3.80–5.20)
RDW: 14 % (ref 11.5–14.5)
WBC: 13.5 10*3/uL — AB (ref 3.6–11.0)

## 2014-08-26 LAB — COMPREHENSIVE METABOLIC PANEL
ALK PHOS: 116 U/L (ref 38–126)
ALT: 38 U/L (ref 14–54)
AST: 48 U/L — ABNORMAL HIGH (ref 15–41)
Albumin: 4.5 g/dL (ref 3.5–5.0)
Anion gap: 13 (ref 5–15)
BUN: 13 mg/dL (ref 6–20)
CALCIUM: 9.7 mg/dL (ref 8.9–10.3)
CHLORIDE: 102 mmol/L (ref 101–111)
CO2: 26 mmol/L (ref 22–32)
Creatinine, Ser: 0.95 mg/dL (ref 0.44–1.00)
GFR calc Af Amer: >60 mL/min (ref >60)
GFR calc non Af Amer: >60 mL/min (ref >60)
Glucose, Bld: 63 mg/dL — ABNORMAL LOW (ref 65–99)
Potassium: 3.7 mmol/L (ref 3.5–5.1)
Sodium: 141 mmol/L (ref 135–145)
Total Bilirubin: 0.5 mg/dL (ref 0.3–1.2)
Total Protein: 8.9 g/dL — ABNORMAL HIGH (ref 6.5–8.1)

## 2014-08-26 LAB — URINALYSIS COMPLETE WITH MICROSCOPIC (ARMC ONLY)
Bilirubin Urine: NEGATIVE
GLUCOSE, UA: 150 mg/dL — AB
HGB URINE DIPSTICK: NEGATIVE
Ketones, ur: NEGATIVE mg/dL
LEUKOCYTES UA: NEGATIVE
Nitrite: NEGATIVE
PH: 6 (ref 5.0–8.0)
Protein, ur: NEGATIVE mg/dL
SPECIFIC GRAVITY, URINE: 1.017 (ref 1.005–1.030)

## 2014-08-26 LAB — TROPONIN I: Troponin I: <0.03 ng/mL (ref <0.031)

## 2014-08-26 MED ORDER — SODIUM CHLORIDE 0.9 % IV SOLN
1000.0000 mL | Freq: Once | INTRAVENOUS | Status: AC
  Administered 2014-08-26: 1000 mL via INTRAVENOUS

## 2014-08-26 MED ORDER — HYDROMORPHONE HCL 1 MG/ML IJ SOLN
0.5000 mg | Freq: Once | INTRAMUSCULAR | Status: AC
  Administered 2014-08-26: 0.5 mg via INTRAVENOUS

## 2014-08-26 MED ORDER — IOHEXOL 240 MG/ML SOLN
25.0000 mL | Freq: Once | INTRAMUSCULAR | Status: AC | PRN
  Administered 2014-08-26: 25 mL via ORAL

## 2014-08-26 MED ORDER — IOHEXOL 300 MG/ML  SOLN
100.0000 mL | Freq: Once | INTRAMUSCULAR | Status: AC | PRN
  Administered 2014-08-26: 100 mL via INTRAVENOUS

## 2014-08-26 MED ORDER — ONDANSETRON HCL 4 MG/2ML IJ SOLN
INTRAMUSCULAR | Status: AC
  Administered 2014-08-26: 4 mg via INTRAVENOUS
  Filled 2014-08-26: qty 2

## 2014-08-26 MED ORDER — ONDANSETRON HCL 4 MG/2ML IJ SOLN
4.0000 mg | Freq: Once | INTRAMUSCULAR | Status: AC
  Administered 2014-08-26: 4 mg via INTRAVENOUS

## 2014-08-26 MED ORDER — HYDROMORPHONE HCL 1 MG/ML IJ SOLN
INTRAMUSCULAR | Status: AC
  Administered 2014-08-26: 0.5 mg via INTRAVENOUS
  Filled 2014-08-26: qty 1

## 2014-08-26 NOTE — ED Notes (Signed)
Patient transported to CT 

## 2014-08-26 NOTE — ED Provider Notes (Signed)
[facility] Emergency Department Provider Note  ____________________________________________  Time seen: 9:45 PM  I have reviewed the triage vital signs and the nursing notes.   HISTORY  Chief Complaint Abdominal Pain and Chest Pain      HPI [name] is a 49 y.o. female who presents with abdominal pain. She reports moderate to severe cramping pain in the left lower quadrant of her abdomen. She reports nausea but no vomiting. No fevers no chills. Normal bowel movements. She has a history of gastric bypass. She has never had this pain before. She denies dysuria, no back pain. She denies a history of diverticulitis     Past Medical History  Diagnosis Date  . Diabetes mellitus without complication   . Hypercholesteremia     There are no active problems to display for this patient.   Past Surgical History  Procedure Laterality Date  . Gastric bypass    . Partial hysterectomy    . Knee surgery    . Cholecystectomy      No current outpatient prescriptions on file.  Allergies Penicillins; Morphine and related; and Reglan  No family history on file.  Social History History  Substance Use Topics  . Smoking status: Never Smoker   . Smokeless tobacco: Never Used  . Alcohol Use: No    Review of Systems  Constitutional: Negative for fever. Eyes: Negative for visual changes. ENT: Negative for sore throat Cardiovascular: Negative for chest pain. Respiratory: Negative for shortness of breath. Gastrointestinal: Positive for abdominal pain and nausea Genitourinary: Negative for dysuria. Musculoskeletal: Negative for back pain. Skin: Negative for rash. Neurological: Negative for headaches or focal weakness Psychiatric: Positive for anxiety  10-point ROS otherwise negative.  ____________________________________________   PHYSICAL EXAM:  VITAL SIGNS: ED Triage Vitals  Enc Vitals Group     BP 08/26/14 2053 150/109 mmHg     Pulse  Rate 08/26/14 2053 98     Resp 08/26/14 2053 18     Temp 08/26/14 2053 98 F (36.7 C)     Temp Source 08/26/14 2053 Oral     SpO2 08/26/14 2053 99 %     Weight 08/26/14 2053 230 lb (104.327 kg)     Height 08/26/14 2053 5' (1.524 m)     Head Cir --      Peak Flow --      Pain Score 08/26/14 2053 10     Pain Loc --      Pain Edu? --      Excl. in [place]? --      Constitutional: Alert and oriented. Well appearing and in no distress. Eyes: Conjunctivae are normal. PERRL. ENT   Head: Normocephalic and atraumatic.   Nose: No rhinnorhea.   Mouth/Throat: Mucous membranes are moist. Cardiovascular: Normal rate, regular rhythm. Normal and symmetric distal pulses are present in all extremities. No murmurs, rubs, or gallops. Respiratory: Normal respiratory effort without tachypnea nor retractions. Breath sounds are clear and equal bilaterally.  Gastrointestinal: Patient with tenderness to palpation and is moderate in the left lower quadrant especially. No distention. There is no CVA tenderness. Genitourinary: deferred Musculoskeletal: Nontender with normal range of motion in all extremities. No lower extremity tenderness nor edema. Neurologic:  Normal speech and language. No gross focal neurologic deficits are appreciated. Skin:  Skin is warm, dry and intact. No rash noted. Psychiatric: Mood and affect are normal. Patient exhibits appropriate insight and judgment.  ____________________________________________    LABS (pertinent positives/negatives)  Labs Reviewed  CBC WITH  DIFFERENTIAL/PLATELET - Abnormal; Notable for the following:    WBC 13.5 (*)    MCHC 31.9 (*)    Platelets 457 (*)    Lymphs Abs 6.4 (*)    All other components within normal limits  COMPREHENSIVE METABOLIC PANEL - Abnormal; Notable for the following:    Glucose, Bld 63 (*)    Total Protein 8.9 (*)    AST 48 (*)    All other components within normal limits  URINALYSIS COMPLETEWITH MICROSCOPIC ([facility] ONLY)  - Abnormal; Notable for the following:    Color, Urine YELLOW (*)    APPearance CLEAR (*)    Glucose, UA 150 (*)    Bacteria, UA RARE (*)    Squamous Epithelial / LPF 0-5 (*)    All other components within normal limits  TROPONIN I    ____________________________________________   EKG  ED ECG REPORT ED ECG REPORT I, Lavonia Drafts, the attending physician, personally viewed and interpreted this ECG.  Date: 08/26/2014 EKG Time: 8:56 PM Rate: 91 Rhythm: normal sinus rhythm QRS Axis: normal Intervals: normal ST/T Wave abnormalities: normal Conduction Disutrbances: none Narrative Interpretation: unremarkable   ____________________________________________    RADIOLOGY  CT scan pending  ____________________________________________   PROCEDURES  Procedure(s) performed: none  Critical Care performed: none  ____________________________________________   INITIAL IMPRESSION / ASSESSMENT AND PLAN / ED COURSE  Pertinent labs & imaging results that were available during my care of the patient were reviewed by me and considered in my medical decision making (see chart for details).  Patient with history of gastric bypass as well as diabetes presents with left lower quadrant tenderness to palpation and a mildly elevated white blood cell count. We will give IV analgesia and obtain a CT scan of the abdomen. I'm suspicious of diverticulitis.    Patient signed out to Dr. Owens Shark, he will follow up on the CT scan ____________________________________________   FINAL CLINICAL IMPRESSION(S) / ED DIAGNOSES  Final diagnoses:  Left lower quadrant pain     Lavonia Drafts, MD 08/26/14 [phone]

## 2014-08-26 NOTE — ED Notes (Signed)
Pt presents to ED with pain around her umbilicus on Saturday while cleaning with nausea. Pt states yesterday the pain radiated down her left leg and into the left side of her chest making her feel short of breath. Hx of Gastric bypass surgery 2 years ago. Denies vomiting. Pt alert and talkative with no increased work of breathing or acute distress noted at this time.

## 2014-08-27 ENCOUNTER — Encounter: Payer: Self-pay | Admitting: *Deleted

## 2014-08-27 ENCOUNTER — Other Ambulatory Visit: Payer: Self-pay

## 2014-08-27 ENCOUNTER — Emergency Department
Admission: EM | Admit: 2014-08-27 | Discharge: 2014-08-27 | Disposition: A | Discharge status: Home / Self Care | Payer: Medicare Other | Attending: Emergency Medicine | Admitting: Emergency Medicine

## 2014-08-27 ENCOUNTER — Encounter: Payer: Self-pay | Admitting: Emergency Medicine

## 2014-08-27 DIAGNOSIS — E119 Type 2 diabetes mellitus without complications: Secondary | ICD-10-CM | POA: Insufficient documentation

## 2014-08-27 DIAGNOSIS — Z79899 Other long term (current) drug therapy: Secondary | ICD-10-CM | POA: Insufficient documentation

## 2014-08-27 DIAGNOSIS — F329 Major depressive disorder, single episode, unspecified: Secondary | ICD-10-CM | POA: Insufficient documentation

## 2014-08-27 DIAGNOSIS — R1032 Left lower quadrant pain: Secondary | ICD-10-CM | POA: Insufficient documentation

## 2014-08-27 DIAGNOSIS — Z880 Allergy status to penicillin: Secondary | ICD-10-CM | POA: Insufficient documentation

## 2014-08-27 DIAGNOSIS — R1084 Generalized abdominal pain: Secondary | ICD-10-CM | POA: Diagnosis present

## 2014-08-27 DIAGNOSIS — K439 Ventral hernia without obstruction or gangrene: Secondary | ICD-10-CM

## 2014-08-27 DIAGNOSIS — R109 Unspecified abdominal pain: Secondary | ICD-10-CM | POA: Diagnosis present

## 2014-08-27 HISTORY — DX: Bipolar disorder, unspecified: F31.9

## 2014-08-27 MED ORDER — GI COCKTAIL ~~LOC~~: 30.0000 mL | Freq: Once | ORAL | Status: DC

## 2014-08-27 MED ORDER — GI COCKTAIL ~~LOC~~
30.0000 mL | Freq: Once | ORAL | Status: AC
  Administered 2014-08-27: 30 mL via ORAL

## 2014-08-27 MED ORDER — GI COCKTAIL ~~LOC~~
ORAL | Status: AC
  Filled 2014-08-27: qty 30

## 2014-08-27 MED ORDER — ONDANSETRON HCL 4 MG PO TABS: 4.0000 mg | ORAL_TABLET | Freq: Four times a day (QID) | ORAL | Status: DC | PRN

## 2014-08-27 MED ORDER — ACETAMINOPHEN 325 MG PO TABS: 650.0000 mg | ORAL_TABLET | Freq: Four times a day (QID) | ORAL | Status: DC | PRN

## 2014-08-27 NOTE — ED Notes (Signed)
Pt reports pain around the navel that radiates to her chest and legs. Pt was seen here for the same, referred to surgery services for hernia repar.

## 2014-08-27 NOTE — ED Provider Notes (Addendum)
[facility] Emergency Department Provider Note  ____________________________________________  Time seen: 6:50 PM  I have reviewed the triage vital signs and the nursing notes.   HISTORY  Chief Complaint Abdominal Pain    HPI [name] is a 49 y.o. female who complains of generalized abdominal pain for 3-4 days. She reports she was seen here yesterday and told she had a ventral hernia and needed to go to surgery clinic. She went to surgery clinic this morning without an appointment and was told that she could not be seen. She then presented to the ED this morning, was found to be stable and discharged again. At that time our charge nurse called the surgery clinic and made her an appointment for June 21. The patient returns to the ED again with persistent pain. She reports persistent nausea but no vomiting. Normal bowel movements. No fever or chills or syncope. No chest pain.  Review of the narcotic controlled substance database reveals that the patient receives large prescriptions of tramadol every month. She gets it from the same provider on a monthly basis indicating that she is being treated for chronic pain. They recently decreased her from 180 pills per month to 150 pills per month, and she normally refills her prescription approximate 5-7 days from now in the middle of the month.     Past Medical History  Diagnosis Date  . Diabetes mellitus without complication   . Hypercholesteremia   . Bipolar 1 disorder     There are no active problems to display for this patient.   Past Surgical History  Procedure Laterality Date  . Gastric bypass    . Partial hysterectomy    . Knee surgery    . Cholecystectomy      Current Outpatient Rx  Name  Route  Sig  Dispense  Refill  . atorvastatin (LIPITOR) 40 MG tablet   Oral   Take 40 mg by mouth daily.         Marland Kitchen gabapentin (NEURONTIN) 100 MG capsule   Oral   Take 800 mg by mouth QID.         Marland Kitchen  QUEtiapine (SEROQUEL) 400 MG tablet   Oral   Take 800 mg by mouth at bedtime.         . sertraline (ZOLOFT) 100 MG tablet   Oral   Take 100 mg by mouth daily.         Marland Kitchen tiZANidine (ZANAFLEX) 4 MG capsule   Oral   Take 4 mg by mouth 4 (four) times daily.         Marland Kitchen acetaminophen (TYLENOL) 325 MG tablet   Oral   Take 2 tablets (650 mg total) by mouth every 6 (six) hours as needed.   60 tablet   0   . ondansetron (ZOFRAN) 4 MG tablet   Oral   Take 1 tablet (4 mg total) by mouth every 6 (six) hours as needed for nausea or vomiting.   20 tablet   1     Allergies Penicillins; Morphine and related; and Reglan  No family history on file.  Social History History  Substance Use Topics  . Smoking status: Never Smoker   . Smokeless tobacco: Never Used  . Alcohol Use: No    Review of Systems  Constitutional: No fever or chills. No weight changes Eyes:No blurry vision or double vision.  ENT: No sore throat. Cardiovascular: No chest pain. Respiratory: No dyspnea or cough. Gastrointestinal: Generalized abdominal pain as  above, no vomiting or diarrhea.  No BRBPR or melena. Genitourinary: Negative for dysuria, urinary retention, bloody urine, or difficulty urinating. Musculoskeletal: Negative for back pain. No joint swelling or pain. Skin: Negative for rash. Neurological: Negative for headaches, focal weakness or numbness. Psychiatric:Depression.   Endocrine:No hot/cold intolerance, changes in energy, or sleep difficulty.  10-point ROS otherwise negative.  ____________________________________________   PHYSICAL EXAM:  VITAL SIGNS: ED Triage Vitals  Enc Vitals Group     BP 08/27/14 1842 138/88 mmHg     Pulse Rate 08/27/14 1842 91     Resp 08/27/14 1842 20     Temp 08/27/14 1842 98.6 F (37 C)     Temp Source 08/27/14 1842 Oral     SpO2 08/27/14 1842 100 %     Weight 08/27/14 1842 228 lb (103.42 kg)     Height 08/27/14 1842 5' (1.524 m)     Head Cir --       Peak Flow --      Pain Score 08/27/14 1845 10     Pain Loc --      Pain Edu? --      Excl. in [place]? --      Constitutional: Alert and oriented. Well appearing and in no distress. Eyes: No scleral icterus. No conjunctival pallor. PERRL. EOMI ENT   Head: Normocephalic and atraumatic.   Nose: No congestion/rhinnorhea. No septal hematoma   Mouth/Throat: MMM, no pharyngeal erythema. No peritonsillar mass. No uvula shift.   Neck: No stridor. No SubQ emphysema. No meningismus. Hematological/Lymphatic/Immunilogical: No cervical lymphadenopathy. Cardiovascular: RRR. Normal and symmetric distal pulses are present in all extremities. No murmurs, rubs, or gallops. Respiratory: Normal respiratory effort without tachypnea nor retractions. Breath sounds are clear and equal bilaterally. No wheezes/rales/rhonchi. Gastrointestinal: Soft with mild generalized tenderness. Exam is nonfocal. There is a wide fascial defect in the midline above the umbilicus. No palpable masses.. No distention. .  No rebound, rigidity, or guarding. Genitourinary: deferred Musculoskeletal: Nontender with normal range of motion in all extremities. No joint effusions.  No lower extremity tenderness.  No edema. Neurologic:   Normal speech and language.  CN 2-10 normal. Motor grossly intact. No pronator drift.  Normal gait. No gross focal neurologic deficits are appreciated.  Skin:  Skin is warm, dry and intact. No rash noted.  No petechiae, purpura, or bullae. Psychiatric: Mood and affect are normal. Speech and behavior are normal. Patient exhibits appropriate insight and judgment.  ____________________________________________    LABS (pertinent positives/negatives) (all labs ordered are listed, but only abnormal results are displayed) Labs Reviewed - No data to display ____________________________________________   EKG  Interpreted by me  Date: 08/27/2014  Rate: 83  Rhythm: normal sinus rhythm  QRS Axis:  normal  Intervals: normal  ST/T Wave abnormalities: normal  Conduction Disutrbances: none  Narrative Interpretation: unremarkable      ____________________________________________    RADIOLOGY  CT abdomen and pelvis yesterday confirms a wide mouth hernia without any entrapped tissue and no acute pathology.  ____________________________________________   PROCEDURES  ____________________________________________   INITIAL IMPRESSION / ASSESSMENT AND PLAN / ED COURSE  Pertinent labs & imaging results that were available during my care of the patient were reviewed by me and considered in my medical decision making (see chart for details).  Patient presents with abdominal pain which is likely chronic in nature. She has had multiple abdominal surgeries and also has some mental health issues that likely exacerbate her pain perception. In addition her pain may be  worse due to likely running out of her tramadol recently. Is no evidence of strangulation or incarceration, intra-abdominal abscess or other surgical pathology, sepsis, or other acute medical illness. We will advise the patient that we cannot give her opioid prescriptions for chronic pain and that she needs to follow up with surgery at her appointment time and her primary care doctor  ____________________________________________   FINAL CLINICAL IMPRESSION(S) / ED DIAGNOSES  Final diagnoses:  Generalized abdominal pain  Ventral hernia without obstruction or gangrene      Carrie Mew, MD 08/27/14 2006  On discussing the patient's diagnosing and need to follow-up with her doctor, was able to confirm with the patient that she ran out of her tramadol 2 days ago thus prompting her 3 visits in 2 days to the emergency room for abdominal pain.  When I suggested that she follow up with her primary care doctor for continued medications for her chronic pain, she stated that she is not able to follow-up with her primary care  doctor. She then later stated that she did talk to her primary care doctor who was able see her today but instead told her to come to the ER instead of coming to the clinic.  She also claims an allergy to every NSAID and Tylenol stating that she has a "bad reaction". There are many inconsistencies with her story and it appears to be clear drug-seeking behavior. The patient is discharged without any additional pain medicines here or by prescription.  Carrie Mew, MD 08/27/14 2015

## 2014-08-27 NOTE — Discharge Instructions (Signed)
Abdominal Pain Many things can cause abdominal pain. Usually, abdominal pain is not caused by a disease and will improve without treatment. It can often be observed and treated at home. Your health care provider will do a physical exam and possibly order blood tests and X-rays to help determine the seriousness of your pain. However, in many cases, more time must pass before a clear cause of the pain can be found. Before that point, your health care provider may not know if you need more testing or further treatment. HOME CARE INSTRUCTIONS  Monitor your abdominal pain for any changes. The following actions may help to alleviate any discomfort you are experiencing:  Only take over-the-counter or prescription medicines as directed by your health care provider.  Do not take laxatives unless directed to do so by your health care provider.  Try a clear liquid diet (broth, tea, or water) as directed by your health care provider. Slowly move to a bland diet as tolerated. SEEK MEDICAL CARE IF:  You have unexplained abdominal pain.  You have abdominal pain associated with nausea or diarrhea.  You have pain when you urinate or have a bowel movement.  You experience abdominal pain that wakes you in the night.  You have abdominal pain that is worsened or improved by eating food.  You have abdominal pain that is worsened with eating fatty foods.  You have a fever. SEEK IMMEDIATE MEDICAL CARE IF:   Your pain does not go away within 2 hours.  You keep throwing up (vomiting).  Your pain is felt only in portions of the abdomen, such as the right side or the left lower portion of the abdomen.  You pass bloody or black tarry stools. MAKE SURE YOU:  Understand these instructions.   Will watch your condition.   Will get help right away if you are not doing well or get worse.  Document Released: 12/15/2004 Document Revised: 03/12/2013 Document Reviewed: 11/14/2012 [facility] Patient Information  2015 [place], [place]. This information is not intended to replace advice given to you by your health care provider. Make sure you discuss any questions you have with your health care provider.  Inguinal Hernia, Adult Muscles help keep everything in the body in its proper place. But if a weak spot in the muscles develops, something can poke through. That is called a hernia. When this happens in the lower part of the belly (abdomen), it is called an inguinal hernia. (It takes its name from a part of the body in this region called the inguinal canal.) A weak spot in the wall of muscles lets some fat or part of the small intestine bulge through. An inguinal hernia can develop at any age. Men get them more often than women. CAUSES  In adults, an inguinal hernia develops over time.  It can be triggered by:  Suddenly straining the muscles of the lower abdomen.  Lifting heavy objects.  Straining to have a bowel movement. Difficult bowel movements (constipation) can lead to this.  Constant coughing. This may be caused by smoking or lung disease.  Being overweight.  Being pregnant.  Working at a job that requires long periods of standing or heavy lifting.  Having had an inguinal hernia before. One type can be an emergency situation. It is called a strangulated inguinal hernia. It develops if part of the small intestine slips through the weak spot and cannot get back into the abdomen. The blood supply can be cut off. If that happens, part of  the intestine may die. This situation requires emergency surgery. SYMPTOMS  Often, a small inguinal hernia has no symptoms. It is found when a healthcare provider does a physical exam. Larger hernias usually have symptoms.   In adults, symptoms may include:  A lump in the groin. This is easier to see when the person is standing. It might disappear when lying down.  In men, a lump in the scrotum.  Pain or burning in the groin. This occurs especially when  lifting, straining or coughing.  A dull ache or feeling of pressure in the groin.  Signs of a strangulated hernia can include:  A bulge in the groin that becomes very painful and tender to the touch.  A bulge that turns red or purple.  Fever, nausea and vomiting.  Inability to have a bowel movement or to pass gas. DIAGNOSIS  To decide if you have an inguinal hernia, a healthcare provider will probably do a physical examination.  This will include asking questions about any symptoms you have noticed.  The healthcare provider might feel the groin area and ask you to cough. If an inguinal hernia is felt, the healthcare provider may try to slide it back into the abdomen.  Usually no other tests are needed. TREATMENT  Treatments can vary. The size of the hernia makes a difference. Options include:  Watchful waiting. This is often suggested if the hernia is small and you have had no symptoms.  No medical procedure will be done unless symptoms develop.  You will need to watch closely for symptoms. If any occur, contact your healthcare provider right away.  Surgery. This is used if the hernia is larger or you have symptoms.  Open surgery. This is usually an outpatient procedure (you will not stay overnight in a hospital). An cut (incision) is made through the skin in the groin. The hernia is put back inside the abdomen. The weak area in the muscles is then repaired by herniorrhaphy or hernioplasty. Herniorrhaphy: in this type of surgery, the weak muscles are sewn back together. Hernioplasty: a patch or mesh is used to close the weak area in the abdominal wall.  Laparoscopy. In this procedure, a surgeon makes small incisions. A thin tube with a tiny video camera (called a laparoscope) is put into the abdomen. The surgeon repairs the hernia with mesh by looking with the video camera and using two long instruments. HOME CARE INSTRUCTIONS   After surgery to repair an inguinal hernia:  You  will need to take pain medicine prescribed by your healthcare provider. Follow all directions carefully.  You will need to take care of the wound from the incision.  Your activity will be restricted for awhile. This will probably include no heavy lifting for several weeks. You also should not do anything too active for a few weeks. When you can return to work will depend on the type of job that you have.  During "watchful waiting" periods, you should:  Maintain a healthy weight.  Eat a diet high in fiber (fruits, vegetables and whole grains).  Drink plenty of fluids to avoid constipation. This means drinking enough water and other liquids to keep your urine clear or pale yellow.  Do not lift heavy objects.  Do not stand for long periods of time.  Quit smoking. This should keep you from developing a frequent cough. SEEK MEDICAL CARE IF:   A bulge develops in your groin area.  You feel pain, a burning sensation or pressure in  the groin. This might be worse if you are lifting or straining.  You develop a fever of more than 100.5 F (38.1 C). SEEK IMMEDIATE MEDICAL CARE IF:   Pain in the groin increases suddenly.  A bulge in the groin gets bigger suddenly and does not go down.  For men, there is sudden pain in the scrotum. Or, the size of the scrotum increases.  A bulge in the groin area becomes red or purple and is painful to touch.  You have nausea or vomiting that does not go away.  You feel your heart beating much faster than normal.  You cannot have a bowel movement or pass gas.  You develop a fever of more than 102.0 F (38.9 C). Document Released: 07/24/2008 Document Revised: 05/30/2011 Document Reviewed: 07/24/2008 [facility] Patient Information 2015 [place], [place]. This information is not intended to replace advice given to you by your health care provider. Make sure you discuss any questions you have with your health care provider.

## 2014-08-27 NOTE — ED Notes (Signed)
Pt reports no one is able to come pick her up. Pt notified she can wait in the lobby for a ride.  Pt given meal tray and orange juice upon discharge.

## 2014-08-27 NOTE — ED Notes (Signed)
abd pain , gastric bypass ( x2 years ago) , was seen and discharged with hernia , and follow up with a surgeon, went to [facility] surgical today with no prior appointment, they unable to see her till tomorrow. Here for pain and wanting the hernia repaired .

## 2014-08-27 NOTE — ED Notes (Signed)
MD discussed plan of care with patient. Pt talking to daughter on phone requesting a ride home at this time.

## 2014-08-27 NOTE — ED Provider Notes (Signed)
[facility] Emergency Department Provider Note  ____________________________________________  Time seen: 1100  I have reviewed the triage vital signs and the nursing notes.   HISTORY  Chief Complaint Abdominal Pain   History limited by: Not Limited   HPI [name] is a 49 y.o. female who presents to the emergency department today with continued abdominal pain. The patient was seen in the emergency department last night. She was seen for the same symptoms. Workup last night included a CT scan which showed: IMPRESSION: 1. No acute findings are evident in the abdomen or pelvis. 2. Unchanged wide-mouth umbilical hernia without obstruction or inflammation 3. Prior cholecystectomy, hysterectomy and gastric bypass. 4. Moderately severe lumbar facet arthritis, right greater than Left. Patient was advised to follow-up with surgery. Patient states that she went to the clinic today and was told that they were not able to see her emergently and that she needed emergent surgery eval she would need to be admitted to the hospital. This patient represented to emergency department today with continued pain.     Past Medical History  Diagnosis Date  . Diabetes mellitus without complication   . Hypercholesteremia   . Bipolar 1 disorder     There are no active problems to display for this patient.   Past Surgical History  Procedure Laterality Date  . Gastric bypass    . Partial hysterectomy    . Knee surgery    . Cholecystectomy      No current outpatient prescriptions on file.  Allergies Penicillins; Morphine and related; and Reglan  No family history on file.  Social History History  Substance Use Topics  . Smoking status: Never Smoker   . Smokeless tobacco: Never Used  . Alcohol Use: No    Review of Systems  Constitutional: Negative for fever. Cardiovascular: Negative for chest pain. Respiratory: Negative for shortness of  breath. Gastrointestinal: Positive for abdominal pain Genitourinary: Negative for dysuria. Musculoskeletal: Negative for back pain. Skin: Negative for rash. Neurological: Negative for headaches, focal weakness or numbness.   10-point ROS otherwise negative.  ____________________________________________   PHYSICAL EXAM:  VITAL SIGNS: ED Triage Vitals  Enc Vitals Group     BP 08/27/14 1024 127/84 mmHg     Pulse Rate 08/27/14 1024 85     Resp 08/27/14 1024 20     Temp 08/27/14 1024 98.2 F (36.8 C)     Temp Source 08/27/14 1024 Oral     SpO2 08/27/14 1024 97 %     Weight 08/27/14 1024 228 lb (103.42 kg)     Height 08/27/14 1024 5' (1.524 m)     Head Cir --      Peak Flow --      Pain Score 08/27/14 1041 10   Constitutional: Alert and oriented. Appears in mild discomfort Eyes: Conjunctivae are normal. PERRL. Normal extraocular movements. ENT   Head: Normocephalic and atraumatic.   Nose: No congestion/rhinnorhea.   Mouth/Throat: Mucous membranes are moist.   Neck: No stridor. Hematological/Lymphatic/Immunilogical: No cervical lymphadenopathy. Cardiovascular: Normal rate, regular rhythm.  No murmurs, rubs, or gallops. Respiratory: Normal respiratory effort without tachypnea nor retractions. Breath sounds are clear and equal bilaterally. No wheezes/rales/rhonchi. Gastrointestinal: Soft. Tender to palpation of the left lower quadrant. No rebound. No guarding. Genitourinary: Deferred Musculoskeletal: Normal range of motion in all extremities. No joint effusions.  No lower extremity tenderness nor edema. Neurologic:  Normal speech and language. No gross focal neurologic deficits are appreciated. Speech is normal.  Skin:  Skin is warm, dry and intact. No rash noted. Psychiatric: Mood and affect are normal. Speech and behavior are normal. Patient exhibits appropriate insight and judgment.  ____________________________________________    LABS (pertinent  positives/negatives)  None  ____________________________________________   EKG  None  ____________________________________________    RADIOLOGY  None  ____________________________________________   PROCEDURES  Procedure(s) performed: None  Critical Care performed: No  ____________________________________________   INITIAL IMPRESSION / ASSESSMENT AND PLAN / ED COURSE  Pertinent labs & imaging results that were available during my care of the patient were reviewed by me and considered in my medical decision making (see chart for details).  Patient refers into the emergency department today with continued abdominal pain and discomfort. Was seen last night with the CT scan being performed. No acute cause of pain identified on the CT scan. Additionally blood work without any concerning findings. Patient went to surgery clinic today in hopes of her being seen for the hernia. Is now represents the emergency department with notes for admission to be seen by a surgeon. Had discussion with patient at I did not think an emergent surgery is indicated at this point given the CT findings and blood work from earlier today. Also discussed with patient my has a tendency to repeat CT scan given the radiation exposure. Additionally given negative workup earlier I think repeat blood work is unnecessary at this time. Will discharge patient home with plan to contact surgery for further eval as an outpatient.  ____________________________________________   FINAL CLINICAL IMPRESSION(S) / ED DIAGNOSES  Final diagnoses:  Abdominal pain, unspecified abdominal location     [name], MD 08/27/14 1341

## 2014-08-27 NOTE — Discharge Instructions (Signed)
Please seek medical attention for any high fevers, chest pain, shortness of breath, change in behavior, persistent vomiting, bloody stool or any other new or concerning symptoms.  Abdominal Pain, Women Abdominal (stomach, pelvic, or belly) pain can be caused by many things. It is important to tell your doctor:  The location of the pain.  Does it come and go or is it present all the time?  Are there things that start the pain (eating certain foods, exercise)?  Are there other symptoms associated with the pain (fever, nausea, vomiting, diarrhea)? All of this is helpful to know when trying to find the cause of the pain. CAUSES   Stomach: virus or bacteria infection, or ulcer.  Intestine: appendicitis (inflamed appendix), regional ileitis (Crohn's disease), ulcerative colitis (inflamed colon), irritable bowel syndrome, diverticulitis (inflamed diverticulum of the colon), or cancer of the stomach or intestine.  Gallbladder disease or stones in the gallbladder.  Kidney disease, kidney stones, or infection.  Pancreas infection or cancer.  Fibromyalgia (pain disorder).  Diseases of the female organs:  Uterus: fibroid (non-cancerous) tumors or infection.  Fallopian tubes: infection or tubal pregnancy.  Ovary: cysts or tumors.  Pelvic adhesions (scar tissue).  Endometriosis (uterus lining tissue growing in the pelvis and on the pelvic organs).  Pelvic congestion syndrome (female organs filling up with blood just before the menstrual period).  Pain with the menstrual period.  Pain with ovulation (producing an egg).  Pain with an IUD (intrauterine device, birth control) in the uterus.  Cancer of the female organs.  Functional pain (pain not caused by a disease, may improve without treatment).  Psychological pain.  Depression. DIAGNOSIS  Your doctor will decide the seriousness of your pain by doing an examination.  Blood tests.  X-rays.  Ultrasound.  CT scan  (computed tomography, special type of X-ray).  MRI (magnetic resonance imaging).  Cultures, for infection.  Barium enema (dye inserted in the large intestine, to better view it with X-rays).  Colonoscopy (looking in intestine with a lighted tube).  Laparoscopy (minor surgery, looking in abdomen with a lighted tube).  Major abdominal exploratory surgery (looking in abdomen with a large incision). TREATMENT  The treatment will depend on the cause of the pain.   Many cases can be observed and treated at home.  Over-the-counter medicines recommended by your caregiver.  Prescription medicine.  Antibiotics, for infection.  Birth control pills, for painful periods or for ovulation pain.  Hormone treatment, for endometriosis.  Nerve blocking injections.  Physical therapy.  Antidepressants.  Counseling with a psychologist or psychiatrist.  Minor or major surgery. HOME CARE INSTRUCTIONS   Do not take laxatives, unless directed by your caregiver.  Take over-the-counter pain medicine only if ordered by your caregiver. Do not take aspirin because it can cause an upset stomach or bleeding.  Try a clear liquid diet (broth or water) as ordered by your caregiver. Slowly move to a bland diet, as tolerated, if the pain is related to the stomach or intestine.  Have a thermometer and take your temperature several times a day, and record it.  Bed rest and sleep, if it helps the pain.  Avoid sexual intercourse, if it causes pain.  Avoid stressful situations.  Keep your follow-up appointments and tests, as your caregiver orders.  If the pain does not go away with medicine or surgery, you may try:  Acupuncture.  Relaxation exercises (yoga, meditation).  Group therapy.  Counseling. SEEK MEDICAL CARE IF:   You notice certain foods cause stomach  pain.  Your home care treatment is not helping your pain.  You need stronger pain medicine.  You want your IUD removed.  You  feel faint or lightheaded.  You develop nausea and vomiting.  You develop a rash.  You are having side effects or an allergy to your medicine. SEEK IMMEDIATE MEDICAL CARE IF:   Your pain does not go away or gets worse.  You have a fever.  Your pain is felt only in portions of the abdomen. The right side could possibly be appendicitis. The left lower portion of the abdomen could be colitis or diverticulitis.  You are passing blood in your stools (bright red or black tarry stools, with or without vomiting).  You have blood in your urine.  You develop chills, with or without a fever.  You pass out. MAKE SURE YOU:   Understand these instructions.  Will watch your condition.  Will get help right away if you are not doing well or get worse. Document Released: 01/02/2007 Document Revised: 07/22/2013 Document Reviewed: 01/22/2009 [facility] Patient Information 2015 [place], [place]. This information is not intended to replace advice given to you by your health care provider. Make sure you discuss any questions you have with your health care provider.

## 2014-09-04 ENCOUNTER — Ambulatory Visit (INDEPENDENT_AMBULATORY_CARE_PROVIDER_SITE_OTHER): Payer: Medicare Other | Admitting: Nurse Practitioner

## 2014-09-04 ENCOUNTER — Encounter: Payer: Self-pay | Admitting: Nurse Practitioner

## 2014-09-04 VITALS — BP 114/82 | HR 75 | Temp 98.2°F | Resp 16 | Ht 60.0 in | Wt 253.8 lb

## 2014-09-04 DIAGNOSIS — Z7189 Other specified counseling: Secondary | ICD-10-CM

## 2014-09-04 DIAGNOSIS — M549 Dorsalgia, unspecified: Secondary | ICD-10-CM

## 2014-09-04 DIAGNOSIS — F319 Bipolar disorder, unspecified: Secondary | ICD-10-CM | POA: Diagnosis not present

## 2014-09-04 DIAGNOSIS — E1142 Type 2 diabetes mellitus with diabetic polyneuropathy: Secondary | ICD-10-CM | POA: Diagnosis not present

## 2014-09-04 DIAGNOSIS — Z7689 Persons encountering health services in other specified circumstances: Secondary | ICD-10-CM

## 2014-09-04 NOTE — Progress Notes (Signed)
Subjective:    Patient ID: [name], female    DOB: 07-09-1965, 49 y.o.   MRN: [id number]  HPI  [name] is a 49 yo female establishing care today CC breast reduction.  1) New pt info:   Immunizations- tdap- 4-5 years ago  Mammogram- 3 years ago   Pap- Partial hysterectomy, 3 years ago    Eye Exam- 3/14, not UTD  Dental Exam- Not UTD  Foot exam- 4/16   2) Chronic Problems-  Diabetic- Levemir 60 units daily  Chronic pain- [facility] pain clinic, on tramadol   Bipolar Disorder 1- Kathleen at [employer], no recent episodes   Acute- wants to discuss breast reduction at a later date.  Review of Systems  Constitutional: Negative for fever, chills, diaphoresis, fatigue and unexpected weight change.  HENT: Negative for tinnitus and trouble swallowing.   Gastrointestinal: Negative for nausea, vomiting, abdominal pain, diarrhea, constipation and blood in stool.  Endocrine: Negative for polydipsia, polyphagia and polyuria.  Genitourinary: Negative for dysuria, hematuria, vaginal discharge and vaginal pain.  Musculoskeletal: Negative for myalgias, back pain, arthralgias and gait problem.  Skin: Negative for color change and rash.  Neurological: Negative for dizziness, weakness, numbness and headaches.  Hematological: Does not bruise/bleed easily.  Psychiatric/Behavioral: Negative for suicidal ideas and sleep disturbance. The patient is not nervous/anxious.    Past Medical History  Diagnosis Date  . Diabetes mellitus without complication   . Hypercholesteremia   . Bipolar 1 disorder   . Arthritis   . Depression   . Blood transfusion without reported diagnosis   . Chicken pox   . History of stomach ulcers   . History of fainting spells of unknown cause     History   Social History  . Marital Status: Married    Spouse Name: N/A  . Number of Children: N/A  . Years of Education: N/A   Occupational History  . Not on file.   Social History Main Topics    . Smoking status: Never Smoker   . Smokeless tobacco: Never Used  . Alcohol Use: No  . Drug Use: No  . Sexual Activity:    Partners: Male    Birth Control/ Protection: Surgical   Other Topics Concern  . 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 Gastric bypass    . Partial hysterectomy    . Knee surgery    . Cholecystectomy    . Abdominal hysterectomy  08/11/2010    Partial    Family History  Problem Relation Age of Onset  . Stroke Mother   . Diabetes Mother   . Stroke Father   . Hypertension Father     Allergies  Allergen Reactions  . Penicillins Anaphylaxis  . Morphine And Related Itching  . Reglan [Metoclopramide]     dizziness    Current Outpatient Prescriptions on File Prior to Visit  Medication Sig Dispense Refill  . gabapentin (NEURONTIN) 100 MG capsule Take 800 mg by mouth QID.    Marland Kitchen ondansetron (ZOFRAN) 4 MG tablet Take 1 tablet (4 mg total) by mouth every 6 (six)  hours as needed for nausea or vomiting. 20 tablet 1  . QUEtiapine (SEROQUEL) 400 MG tablet Take 800 mg by mouth at bedtime.    . sertraline (ZOLOFT) 100 MG tablet Take 100 mg by mouth daily.    Marland Kitchen tiZANidine (ZANAFLEX) 4 MG capsule Take 4 mg by mouth 4 (four) times daily.     No current facility-administered medications on file prior to visit.      Objective:   Physical Exam  Constitutional: She is oriented to person, place, and time. She appears well-developed and well-nourished. No distress.  BP 114/82 mmHg  Pulse 75  Temp(Src) 98.2 F (36.8 C)  Resp 16  Ht 5' (1.524 m)  Wt 253 lb 12.8 oz (115.123 kg)  BMI 49.57 kg/m2  SpO2 98%   HENT:  Head: Normocephalic and atraumatic.  Right Ear:  External ear normal.  Left Ear: External ear normal.  Cardiovascular: Normal rate, regular rhythm, normal heart sounds and intact distal pulses.  Exam reveals no gallop and no friction rub.   No murmur heard. Pulmonary/Chest: Effort normal and breath sounds normal. No respiratory distress. She has no wheezes. She has no rales. She exhibits no tenderness.  Abdominal:  Obese  Musculoskeletal:  Walking with a cane  Neurological: She is alert and oriented to person, place, and time. No cranial nerve deficit. She exhibits normal muscle tone. Coordination normal.  Skin: Skin is warm and dry. No rash noted. She is not diaphoretic.  Psychiatric: She has a normal mood and affect. Her behavior is normal. Judgment and thought content normal.      Assessment & Plan:

## 2014-09-04 NOTE — Progress Notes (Signed)
Pre visit review using our clinic review tool, if applicable. No additional management support is needed unless otherwise documented below in the visit note. 

## 2014-09-04 NOTE — Patient Instructions (Addendum)
Welcome to [facility]!   Follow up in 3 months.

## 2014-09-08 DIAGNOSIS — E119 Type 2 diabetes mellitus without complications: Secondary | ICD-10-CM | POA: Insufficient documentation

## 2014-09-08 DIAGNOSIS — M5137 Other intervertebral disc degeneration, lumbosacral region: Secondary | ICD-10-CM | POA: Insufficient documentation

## 2014-09-08 DIAGNOSIS — M51379 Other intervertebral disc degeneration, lumbosacral region without mention of lumbar back pain or lower extremity pain: Secondary | ICD-10-CM | POA: Insufficient documentation

## 2014-09-08 DIAGNOSIS — M549 Dorsalgia, unspecified: Secondary | ICD-10-CM

## 2014-09-08 DIAGNOSIS — F319 Bipolar disorder, unspecified: Secondary | ICD-10-CM | POA: Insufficient documentation

## 2014-09-08 DIAGNOSIS — Z7689 Persons encountering health services in other specified circumstances: Secondary | ICD-10-CM | POA: Insufficient documentation

## 2014-09-08 HISTORY — DX: Dorsalgia, unspecified: M54.9

## 2014-09-08 NOTE — Assessment & Plan Note (Addendum)
Pt would like to discuss breast reduction at next visit, trying to be seen by [facility] pain clinic, on tramadol and zanaflex

## 2014-09-08 NOTE — Assessment & Plan Note (Signed)
Discussed acute and chronic issues. Reviewed health maintenance measures, PFSHx, and immunizations. Obtain records from previous facility.   

## 2014-09-08 NOTE — Assessment & Plan Note (Signed)
Type I pt reports. Seeing Dr. Nunzio Cory at [facility] specialists

## 2014-09-08 NOTE — Assessment & Plan Note (Signed)
Levemir 60 units daily. Will obtain last records.

## 2014-09-09 ENCOUNTER — Ambulatory Visit: Payer: Self-pay | Admitting: Surgery

## 2014-09-16 ENCOUNTER — Telehealth: Payer: Self-pay

## 2014-09-16 NOTE — Telephone Encounter (Signed)
Scheduled pt appt for 8:00 on 7.6.16

## 2014-09-23 ENCOUNTER — Encounter: Payer: Self-pay | Admitting: Surgery

## 2014-09-23 ENCOUNTER — Ambulatory Visit (INDEPENDENT_AMBULATORY_CARE_PROVIDER_SITE_OTHER): Payer: Medicare Other | Admitting: Surgery

## 2014-09-23 ENCOUNTER — Ambulatory Visit: Payer: Medicare Other | Admitting: Nurse Practitioner

## 2014-09-23 VITALS — BP 142/81 | HR 112 | Temp 98.3°F | Ht 60.0 in | Wt 259.0 lb

## 2014-09-23 DIAGNOSIS — N939 Abnormal uterine and vaginal bleeding, unspecified: Secondary | ICD-10-CM | POA: Diagnosis not present

## 2014-09-23 DIAGNOSIS — R1033 Periumbilical pain: Secondary | ICD-10-CM | POA: Diagnosis not present

## 2014-09-23 NOTE — Progress Notes (Signed)
Surgical Consultation  09/23/2014  [name] is an 49 y.o. female.   CC: Abdominal pain  HPI: This is a patient with several weeks of abdominal pain and she points to her periumbilical area. Also had some nausea but no emesis. A recent CT scan suggested a periumbilical ventral hernia. She denies of any obstructive symptoms and no weight loss. Cesarean exception to her 80 pound weight loss following morbid obesity surgery 2 years ago.  Also describes recent onset of vaginal bleeding last week and has had a hysterectomy.  Past Medical History  Diagnosis Date  . Diabetes mellitus without complication   . Hypercholesteremia   . Bipolar 1 disorder   . Arthritis   . Depression   . Blood transfusion without reported diagnosis   . Chicken pox   . History of stomach ulcers   . History of fainting spells of unknown cause   . Diabetes mellitus, type 2 11/12/2010  . 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 Alease Frame   . Disorder of peripheral nervous system 11/12/2010  . Fibroid 11/12/2010    Overview:  Uterine fibroids with menorrhagia.  Fe def anemia.  GYN: Dr. Robina Ade.  Robotically-assisted total laparoscopic hysterectomy, aspiration of left ovarian cyst, cystoscopy. 08/11/09@[facility] .   . Back pain 09/08/2014  . Apnea, sleep 11/12/2010    Overview:  Overview:  A.  Sleep apnea.  Stated prev was diagnosed with sleep apnea in [place], [place] but moved to [place] and did not follow through with additional testing. ++++++++++++++++++++++++++++++++++++++ November 13, 2011 NPSG @ [facility]  overall AHI 2/hr; O2 nadir 83%. No supine sleep captured; side AHI: 1.9/hr; REM AHI 17.4/hr. ESS: 9/24; BMI 53.9 kg/m2; [name]    Past Surgical History  Procedure Laterality Date  . Gastric bypass    . Partial hysterectomy    . Knee surgery    . Cholecystectomy    . Abdominal hysterectomy  08/11/2010    Partial    Family History  Problem Relation Age  of Onset  . Stroke Mother   . Diabetes Mother   . Stroke Father   . Hypertension Father     Social History:  reports that she has never smoked. She has never used smokeless tobacco. She reports that she does not drink alcohol or use illicit drugs.  Allergies:  Allergies  Allergen Reactions  . Penicillins Anaphylaxis  . Lyrica [Pregabalin]     Shaking, couldn't talk. Possbile seizure per pt.  . Morphine And Related Itching  . Reglan [Metoclopramide]     dizziness  . Tylenol [Acetaminophen]     Went into shock    Medications reviewed.   Review of Systems:   Review of Systems  Constitutional: Negative for fever and chills.  HENT: Negative.   Eyes: Negative.   Respiratory: Negative for cough and shortness of breath.   Cardiovascular: Negative for chest pain, palpitations and leg swelling.  Gastrointestinal: Positive for nausea and abdominal pain. Negative for heartburn, vomiting, diarrhea, constipation, blood in stool and melena.  Genitourinary: Negative.   Musculoskeletal: Negative.   Skin: Negative.   Neurological: Negative.  Negative for weakness.  Endo/Heme/Allergies: Negative.   Psychiatric/Behavioral: Negative.      Physical Exam:  BP 142/81 mmHg  Pulse 112  Temp(Src) 98.3 F (36.8 C) (Oral)  Ht 5' (1.524 m)  Wt 259 lb (117.482 kg)  BMI 50.58 kg/m2  Physical Exam  Constitutional: She is oriented to person, place, and time.  Morbidly obese  HENT:  Head: Normocephalic and atraumatic.  Eyes: No scleral icterus.  Cardiovascular: Normal rate, regular rhythm and normal heart sounds.   Pulmonary/Chest: Effort normal and breath sounds normal. No respiratory distress. She has no wheezes.  Abdominal: Soft. She exhibits no distension. There is no tenderness. There is no rebound and no guarding.  Multiple scars are present none in the adjacent periumbilical area. The closest is in the supraumbilical area. There is no palpable or reducible hernia. And this area is  essentially nontender.  Musculoskeletal: Normal range of motion.  Lymphadenopathy:    She has no cervical adenopathy.  Neurological: She is alert and oriented to person, place, and time.  Skin: Skin is warm and dry.  Psychiatric: Mood and affect normal.  Vitals reviewed.     No results found for this or any previous visit (from the past 48 hour(s)). No results found.  Assessment/Plan:  This patient with abdominal pain she has had a recent colonoscopy. Her pain is in the periumbilical area for which a CT scan suggested a periumbilical ventral hernia. In my personal review of this study I think that all layers of the abdominal wall are present and this may just be an eventration without obvious hernia. There is certainly no incarceration signs on CT scan, physical exam, or in history.   He has recent onset of GYN bleeding after hysterectomy area she requires GYN consultation for this.  At this time I see no surgical indication for her abdominal pain and she can follow up with me on his as-needed basis.  Florene Glen, MD, FACS

## 2014-09-24 ENCOUNTER — Encounter: Payer: Self-pay | Admitting: Nurse Practitioner

## 2014-09-24 ENCOUNTER — Ambulatory Visit (INDEPENDENT_AMBULATORY_CARE_PROVIDER_SITE_OTHER): Payer: Medicare Other | Admitting: Nurse Practitioner

## 2014-09-24 ENCOUNTER — Other Ambulatory Visit: Payer: Self-pay | Admitting: Nurse Practitioner

## 2014-09-24 ENCOUNTER — Telehealth: Payer: Self-pay | Admitting: Nurse Practitioner

## 2014-09-24 ENCOUNTER — Telehealth: Payer: Self-pay | Admitting: *Deleted

## 2014-09-24 VITALS — BP 143/98 | HR 106 | Temp 98.4°F | Resp 16 | Ht 60.0 in | Wt 258.8 lb

## 2014-09-24 DIAGNOSIS — G40109 Localization-related (focal) (partial) symptomatic epilepsy and epileptic syndromes with simple partial seizures, not intractable, without status epilepticus: Secondary | ICD-10-CM | POA: Diagnosis not present

## 2014-09-24 DIAGNOSIS — G8929 Other chronic pain: Secondary | ICD-10-CM

## 2014-09-24 DIAGNOSIS — E11649 Type 2 diabetes mellitus with hypoglycemia without coma: Secondary | ICD-10-CM | POA: Diagnosis not present

## 2014-09-24 DIAGNOSIS — M549 Dorsalgia, unspecified: Secondary | ICD-10-CM

## 2014-09-24 DIAGNOSIS — M545 Low back pain: Secondary | ICD-10-CM

## 2014-09-24 MED ORDER — ONDANSETRON HCL 4 MG PO TABS: 4.0000 mg | ORAL_TABLET | Freq: Four times a day (QID) | ORAL | Status: DC | PRN

## 2014-09-24 MED ORDER — TRAMADOL HCL (ER BIPHASIC) 100 MG PO TB24: 1.0000 | ORAL_TABLET | Freq: Every day | ORAL | Status: DC

## 2014-09-24 NOTE — Telephone Encounter (Signed)
Pt call about prior auth on rx for Tramadol. Pt stated that she is in pain and can't wait 72hrs for approval. Pt wanted to know is there is another med she can take until prior auth has been received. Please advise pt/msn

## 2014-09-24 NOTE — Patient Instructions (Signed)
We will fax your script to the pharmacy listed on your contract.   We will contact you with your referrals in the next month- Neurology, Pain Clinic, and Nutrition education.   Follow up in 4 weeks.

## 2014-09-24 NOTE — Telephone Encounter (Signed)
Fax from pharmacy, needing PA for Tramadol ER. Started online, printed off for [place] to complete, placed in her box.

## 2014-09-24 NOTE — Telephone Encounter (Signed)
Faxed to [place] 7.6.16

## 2014-09-24 NOTE — Telephone Encounter (Signed)
Attempted to call patient, unable to leave a voice mail.  Will attempt again.

## 2014-09-24 NOTE — Telephone Encounter (Signed)
Prior came over on fax.  [name] has started the process already.  See prior note.

## 2014-09-24 NOTE — Progress Notes (Signed)
Pre visit review using our clinic review tool, if applicable. No additional management support is needed unless otherwise documented below in the visit note. 

## 2014-09-24 NOTE — Progress Notes (Signed)
Subjective:    Patient ID: [name], female    DOB: 02-Oct-1965, 49 y.o.   MRN: [id number]  HPI  [name] is a 49 yo female with a follow up for chronic pain.   1) Chronic Pain- lower back pain, "shoots down both legs", exact same on both sides she reports, tingling and numb to ankles, inside and back of thighs and burns. Walking and bending forward worse, best position lying down and pillow to elevate legs, 10/10 pain, 5/10 best   Trauma- 2011 [employer] cart fell onto back. She reports surgeon told her not to let anyone do surgery on her. Unsure of surgeon's name and states he is not in practice anymore. Walking- not helpful  Dilaudid- helped pain clinic in [place]  Tramadol- not helpful  Percocet- helpful Lyrica-  Allergic she reports  Gabapentin- not helpful takes 800 mg 4 x daily  Zanaflex- helpful, takes daily- night before last dosage   2)Seizures- "passing out", fractured shoulder and injured face previously, she reports "daily seizures" with stuttering and twitches. Unsure of previous diagnosis.   3) "Sugars all over the place" 2 months ago 15 lowest blood sugar, she recognizes signs easily and calls EMS to assist she reports. Started sliding scale after that.   Last BS 149 fasting this am Eats small portions, eats fruits, stays away from fried  Gastric bypass- 2 years ago   60 units at bedtime Levemir  Novolog- 10 units when over 200 BS  Hgb A1c 7.3 in April at [place]  Constitutional: Negative for fever, chills, diaphoresis and fatigue.  HENT: Negative for trouble swallowing.   Eyes: Negative for visual disturbance.  Respiratory: Negative for chest tightness, shortness of breath and wheezing.   Cardiovascular: Negative for chest pain, palpitations and leg swelling.  Gastrointestinal: Negative for nausea, vomiting and diarrhea.  Endocrine: Positive for polydipsia and polyuria. Negative for polyphagia.  Genitourinary: Negative for difficulty  urinating.  Musculoskeletal: Positive for myalgias, back pain and gait problem. Negative for neck pain.       Walks with cane  Skin: Negative for rash.  Neurological: Positive for seizures and syncope. Negative for dizziness, tremors, facial asymmetry, speech difficulty, weakness, numbness and headaches.  Psychiatric/Behavioral: Negative for suicidal ideas, hallucinations and sleep disturbance. The patient is not nervous/anxious.    Past Medical History  Diagnosis Date  . Diabetes mellitus without complication   . Hypercholesteremia   . Bipolar 1 disorder   . Arthritis   . Depression   . Blood transfusion without reported diagnosis   . Chicken pox   . History of stomach ulcers   . History of fainting spells of unknown cause   . Diabetes mellitus, type 2 11/12/2010  . 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 Alease Frame   . Disorder of peripheral nervous system 11/12/2010  . Fibroid 11/12/2010    Overview:  Uterine fibroids with menorrhagia.  Fe def anemia.  GYN: Dr. Robina Ade.  Robotically-assisted total laparoscopic hysterectomy, aspiration of left ovarian cyst, cystoscopy. 08/11/09@[facility] .   . Back pain 09/08/2014  . Apnea, sleep 11/12/2010    Overview:  Overview:  A.  Sleep apnea.  Stated prev was diagnosed with sleep apnea in [place], [place] but moved to [place] and did not follow through with additional testing. ++++++++++++++++++++++++++++++++++++++ November 13, 2011 NPSG @ [facility]  overall AHI 2/hr; O2 nadir 83%. No supine sleep captured; side AHI: 1.9/hr; REM AHI  17.4/hr. ESS: 9/24; BMI 53.9 kg/m2; [name]    History   Social History  . Marital Status: Married    Spouse Name: N/A  . Number of Children: N/A  . Years of Education: N/A   Occupational History  . Not on file.   Social History Main Topics  . Smoking status: Never Smoker   . Smokeless tobacco: Never Used  . Alcohol Use: No  . Drug Use: No  . Sexual Activity:     Partners: Male    Birth Control/ Protection: Surgical   Other Topics Concern  . 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 Gastric bypass    . Partial hysterectomy    . Knee surgery    . Cholecystectomy    . Abdominal hysterectomy  08/11/2010    Partial    Family History  Problem Relation Age of Onset  . Stroke Mother   . Diabetes Mother   . Stroke Father   . Hypertension Father     Allergies  Allergen Reactions  . Penicillins Anaphylaxis  . Lyrica [Pregabalin]     Shaking, couldn't talk. Possbile seizure per pt.  . Morphine And Related Itching    Just morphine pt reports  . Reglan [Metoclopramide]     dizziness  . Tylenol [Acetaminophen]     Went into shock    Current Outpatient Prescriptions on File Prior to Visit  Medication Sig Dispense Refill  . gabapentin (NEURONTIN) 100 MG capsule Take 800 mg by mouth QID.    Marland Kitchen insulin aspart (NOVOLOG) 100 UNIT/ML injection Inject 93 Units into the skin 3 (three) times daily before meals. Sliding Scale    . insulin detemir (LEVEMIR) 100 UNIT/ML injection Inject 60 Units into the skin daily.    Marland Kitchen omeprazole (PRILOSEC) 20 MG capsule Take 20 mg by mouth 2 (two) times daily.    . promethazine (PHENERGAN) 25 MG tablet Take 25 mg by mouth every 8 (eight) hours.    Marland Kitchen QUEtiapine (SEROQUEL) 400 MG tablet Take 800 mg by mouth at bedtime.    . sertraline (ZOLOFT) 100 MG tablet Take 100 mg by mouth daily.    Marland Kitchen tiZANidine (ZANAFLEX) 4 MG capsule Take 4 mg by mouth 4 (four) times daily.     No current facility-administered medications on file prior to visit.      Objective:   Physical Exam  Constitutional: She is oriented to person, place,  and time. She appears well-developed and well-nourished. No distress.  BP 143/98 mmHg  Pulse 106  Temp(Src) 98.4 F (36.9 C)  Resp 16  Ht 5' (1.524 m)  Wt 258 lb 12.8 oz (117.391 kg)  BMI 50.54 kg/m2  SpO2 98%   HENT:  Head: Normocephalic and atraumatic.  Right Ear: External ear normal.  Left Ear: External ear normal.  Eyes: EOM are normal. Pupils are equal, round, and reactive to light. Right eye exhibits no discharge. Left eye exhibits no discharge. No scleral icterus.  Neck: Normal range of motion. Neck supple. No thyromegaly present.  Cardiovascular: Normal rate and regular rhythm.   Pulmonary/Chest: Effort normal and breath sounds  normal. No respiratory distress. She has no wheezes. She has no rales. She exhibits no tenderness.  Abdominal:  Obese  Lymphadenopathy:    She has no cervical adenopathy.  Neurological: She is alert and oriented to person, place, and time. No cranial nerve deficit. She exhibits normal muscle tone. Coordination normal.  Iliopsoas 5/5 Bilateral, Tib anterior 5/5 bilateral, EHL 5/5 bilateral, intact heel/toe standing (cannot walk forward), sensation intact upper and lower extremities. Straight leg raise negative bilaterally. She is exquisitely tender to light palpation of bilateral paraspinal muscles of lumbar spine   Skin: Skin is warm and dry. No rash noted. She is not diaphoretic.  Psychiatric: She has a normal mood and affect. Her behavior is normal. Judgment and thought content normal.      Assessment & Plan:

## 2014-09-24 NOTE — Telephone Encounter (Signed)
Please advise 

## 2014-09-24 NOTE — Telephone Encounter (Signed)
She should have some Tramadol left (filled on 08/29/14 from previous provider for 180 tablets). She can take 2 to make 100 mg until PA is approved.

## 2014-09-24 NOTE — Telephone Encounter (Signed)
[place] [phone]. Pt needs prior auth for tramadol (case # [id number]) [name]

## 2014-09-25 NOTE — Telephone Encounter (Signed)
Fax from [employer], [place] approved 06/27/14-09/25/15

## 2014-10-01 ENCOUNTER — Other Ambulatory Visit: Payer: Self-pay | Admitting: Nurse Practitioner

## 2014-10-01 ENCOUNTER — Telehealth: Payer: Self-pay | Admitting: Nurse Practitioner

## 2014-10-01 MED ORDER — METHOCARBAMOL 500 MG PO TABS: 1000.0000 mg | ORAL_TABLET | Freq: Three times a day (TID) | ORAL | Status: DC

## 2014-10-01 NOTE — Telephone Encounter (Signed)
She never got approved for the 100 mg ER version. Is she in need of a different prescription? Is she saying it is not for pain? Or not working?

## 2014-10-01 NOTE — Telephone Encounter (Signed)
Tried to call pt, not available and vm not set up

## 2014-10-01 NOTE — Telephone Encounter (Addendum)
Pt called stating that theTraMADol  is not working for the pain. Please advise pt.

## 2014-10-01 NOTE — Telephone Encounter (Signed)
Please let pt know: I have sent in robaxin take as directed and stop the tizanidine while on this. Thanks!

## 2014-10-01 NOTE — Telephone Encounter (Signed)
Tried to return pt call, lmtcb again

## 2014-10-04 ENCOUNTER — Encounter: Payer: Self-pay | Admitting: Nurse Practitioner

## 2014-10-04 DIAGNOSIS — G8929 Other chronic pain: Secondary | ICD-10-CM | POA: Insufficient documentation

## 2014-10-04 DIAGNOSIS — M545 Low back pain, unspecified: Secondary | ICD-10-CM | POA: Insufficient documentation

## 2014-10-04 NOTE — Assessment & Plan Note (Signed)
Will try tramadol ER 100 mg daily. Asked pt to sign contract and do UDS since we will be bridging the tramadol until seen by Pain Clinic. Referral placed to pain clinic.

## 2014-10-04 NOTE — Assessment & Plan Note (Signed)
Pt still complaining of "seizures" will refer to neurology for evaluation. There is an EEG in Care Everywhere> results in overview.

## 2014-10-04 NOTE — Assessment & Plan Note (Signed)
Pt is not aware of diabetic nutrition and Bipolar disorder complicates nutritional intake. Will refer to nutrition therapy for more information. Pt is a willing learner.

## 2014-10-06 ENCOUNTER — Telehealth: Payer: Self-pay

## 2014-10-06 NOTE — Telephone Encounter (Signed)
Informed pt that Robaxin had been called in and to stop the Tizanidine while on it. Pt verbalized understanding

## 2014-10-06 NOTE — Telephone Encounter (Signed)
Informed pt that Robaxin was called and to stop Tizanidine while on it. Pt verbalized understanding

## 2014-10-06 NOTE — Telephone Encounter (Signed)
Started PA process on Cover my meds for Methocarbamol 500mg  tablets. Will follow up with results

## 2014-10-16 ENCOUNTER — Other Ambulatory Visit: Payer: Self-pay | Admitting: Nurse Practitioner

## 2014-10-16 ENCOUNTER — Telehealth: Payer: Self-pay | Admitting: *Deleted

## 2014-10-16 NOTE — Telephone Encounter (Signed)
We will have to switch her off of Tramadol due to interactions with her Zoloft. When is her pain management appointment?

## 2014-10-16 NOTE — Telephone Encounter (Signed)
Pt called states the Robaxin is not effective.  Further states she does not have any more Tramadol.  Please advise

## 2014-10-16 NOTE — Telephone Encounter (Signed)
Spoke with pt she states she has not been scheduled yet, I transferred to [facility], referral coordinator for status.  It looks like she submitted it on 7.8.16.  Please advise

## 2014-10-16 NOTE — Telephone Encounter (Signed)
She needs to get in to see Pain management. There are very few options left since she is on medications for mood disorders. She can try Capsaicin over the counter (topical, cheap, use gloves to put on body or wash hands well over touching because it can burn the eyes if you touch them after application). I will keep exploring different medications. She can try tylenol extra strength, too.

## 2014-10-17 ENCOUNTER — Telehealth: Payer: Self-pay

## 2014-10-17 ENCOUNTER — Other Ambulatory Visit: Payer: Self-pay | Admitting: Nurse Practitioner

## 2014-10-17 MED ORDER — TRAMADOL HCL 50 MG PO TABS: 50.0000 mg | ORAL_TABLET | Freq: Two times a day (BID) | ORAL | Status: DC

## 2014-10-17 NOTE — Telephone Encounter (Signed)
This message was addressed by Threasa Beards this morning & I have forwarded this note to her for documentation purposes.

## 2014-10-17 NOTE — Telephone Encounter (Signed)
Informed pt that she would get a Rx for Tramadol to get her through the next month until she can get established with Pain management, after that she would not get anymore prescriptions for controlled substances from [place]. Pt verbalized understanding.

## 2014-10-17 NOTE — Telephone Encounter (Signed)
Patient cannot fill the one month of Tramadol (she has had no trouble in past so I will continue for 1 month) until 10/26/14 according to the [employer]. Pt came to pick up script.

## 2014-10-17 NOTE — Telephone Encounter (Signed)
Sent a message on 6.29.16 to [facility] in a separate phone message at what we did.

## 2014-10-21 ENCOUNTER — Telehealth: Payer: Self-pay | Admitting: *Deleted

## 2014-10-21 ENCOUNTER — Other Ambulatory Visit: Payer: Self-pay | Admitting: Nurse Practitioner

## 2014-10-21 MED ORDER — TIZANIDINE HCL 4 MG PO TABS: 4.0000 mg | ORAL_TABLET | Freq: Every day | ORAL | Status: DC

## 2014-10-21 NOTE — Telephone Encounter (Signed)
Tizanidine sent to pharmacy. Thanks

## 2014-10-21 NOTE — Telephone Encounter (Signed)
Pt called states the Robaxin is not effective she is requesting Tizanidine refill instead.  Pt states she did see Pain Management on 8.1.16.  Please advise

## 2014-10-22 ENCOUNTER — Ambulatory Visit: Payer: Medicare Other | Admitting: Nurse Practitioner

## 2014-10-27 ENCOUNTER — Encounter: Payer: Self-pay | Admitting: Nurse Practitioner

## 2014-10-27 IMAGING — CR DG CHEST 2V
1 series · 2 of 2 positions shown · non-contrast
Comparison: 03/08/13

CLINICAL DATA: Dyspnea.  Chest pain.

EXAM:
CHEST  2 VIEW

[Series 1: pa · 0.17mm/px · 2 of 2 slices shown]
[im 1/2]
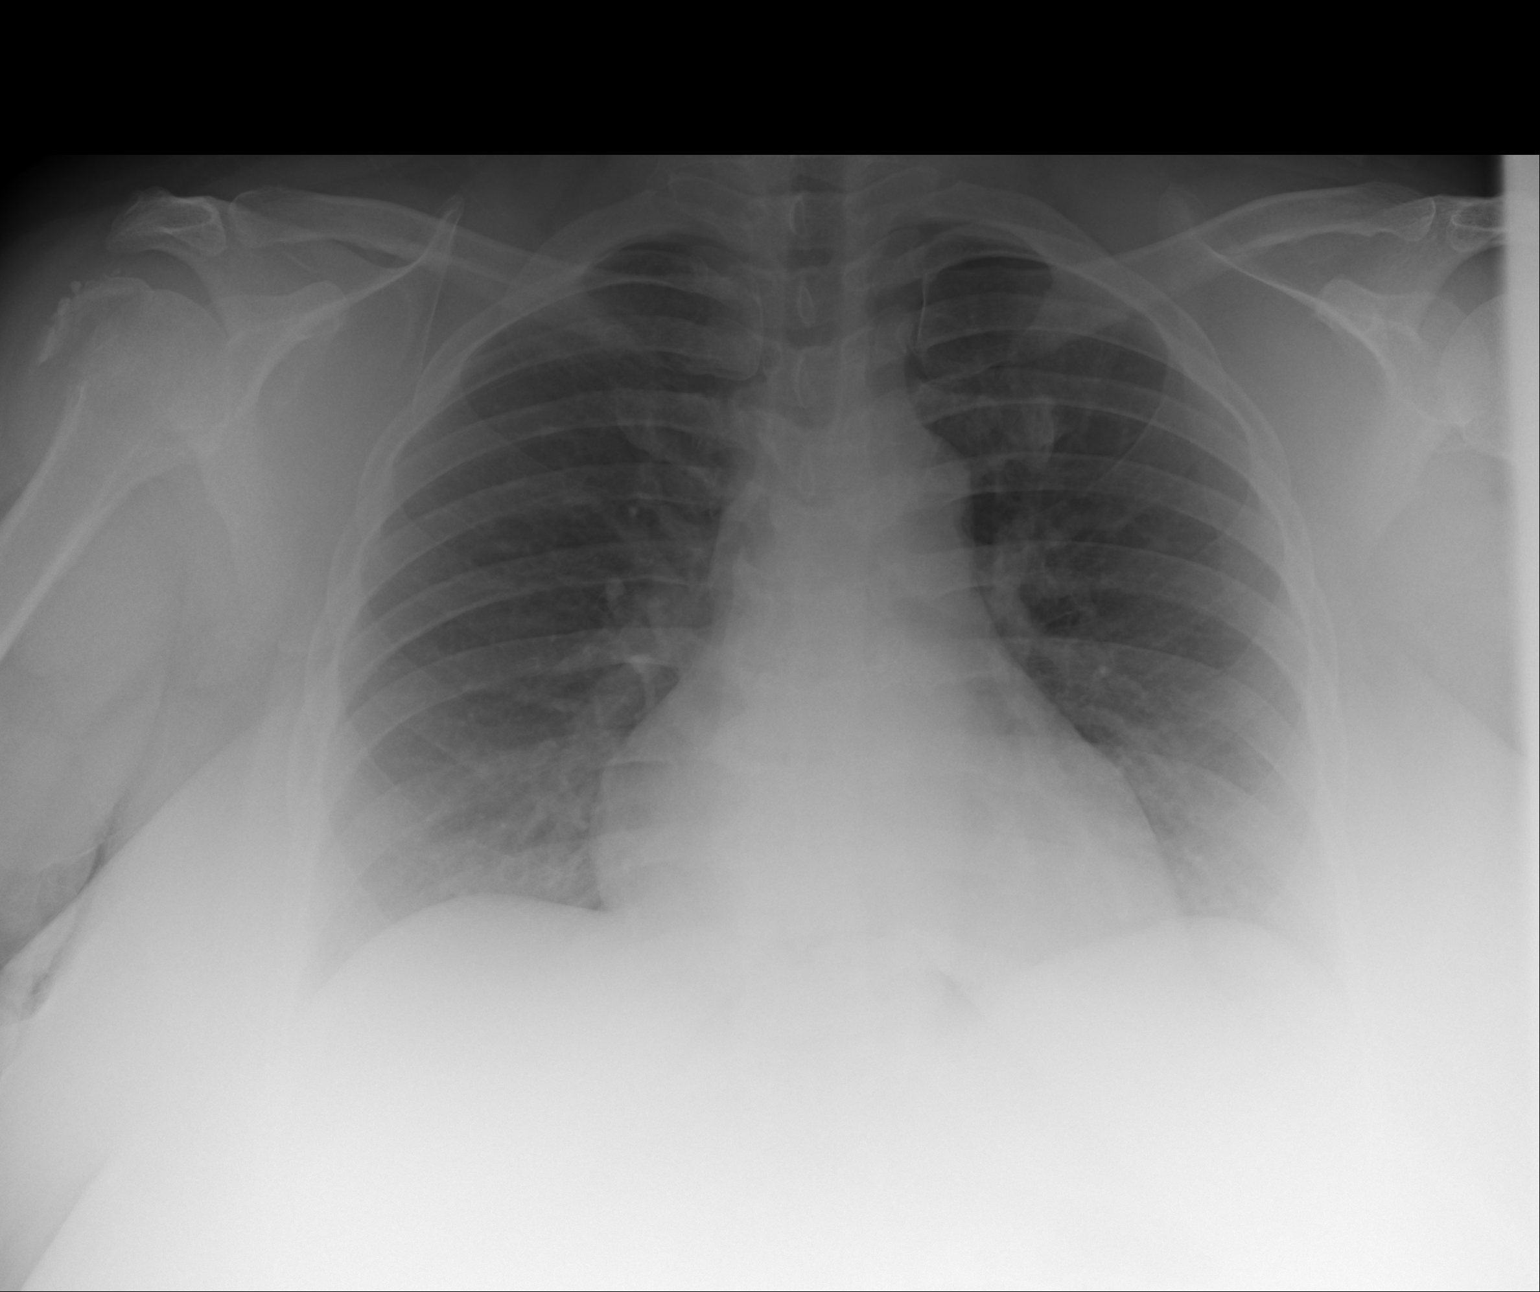
[im 2/2]
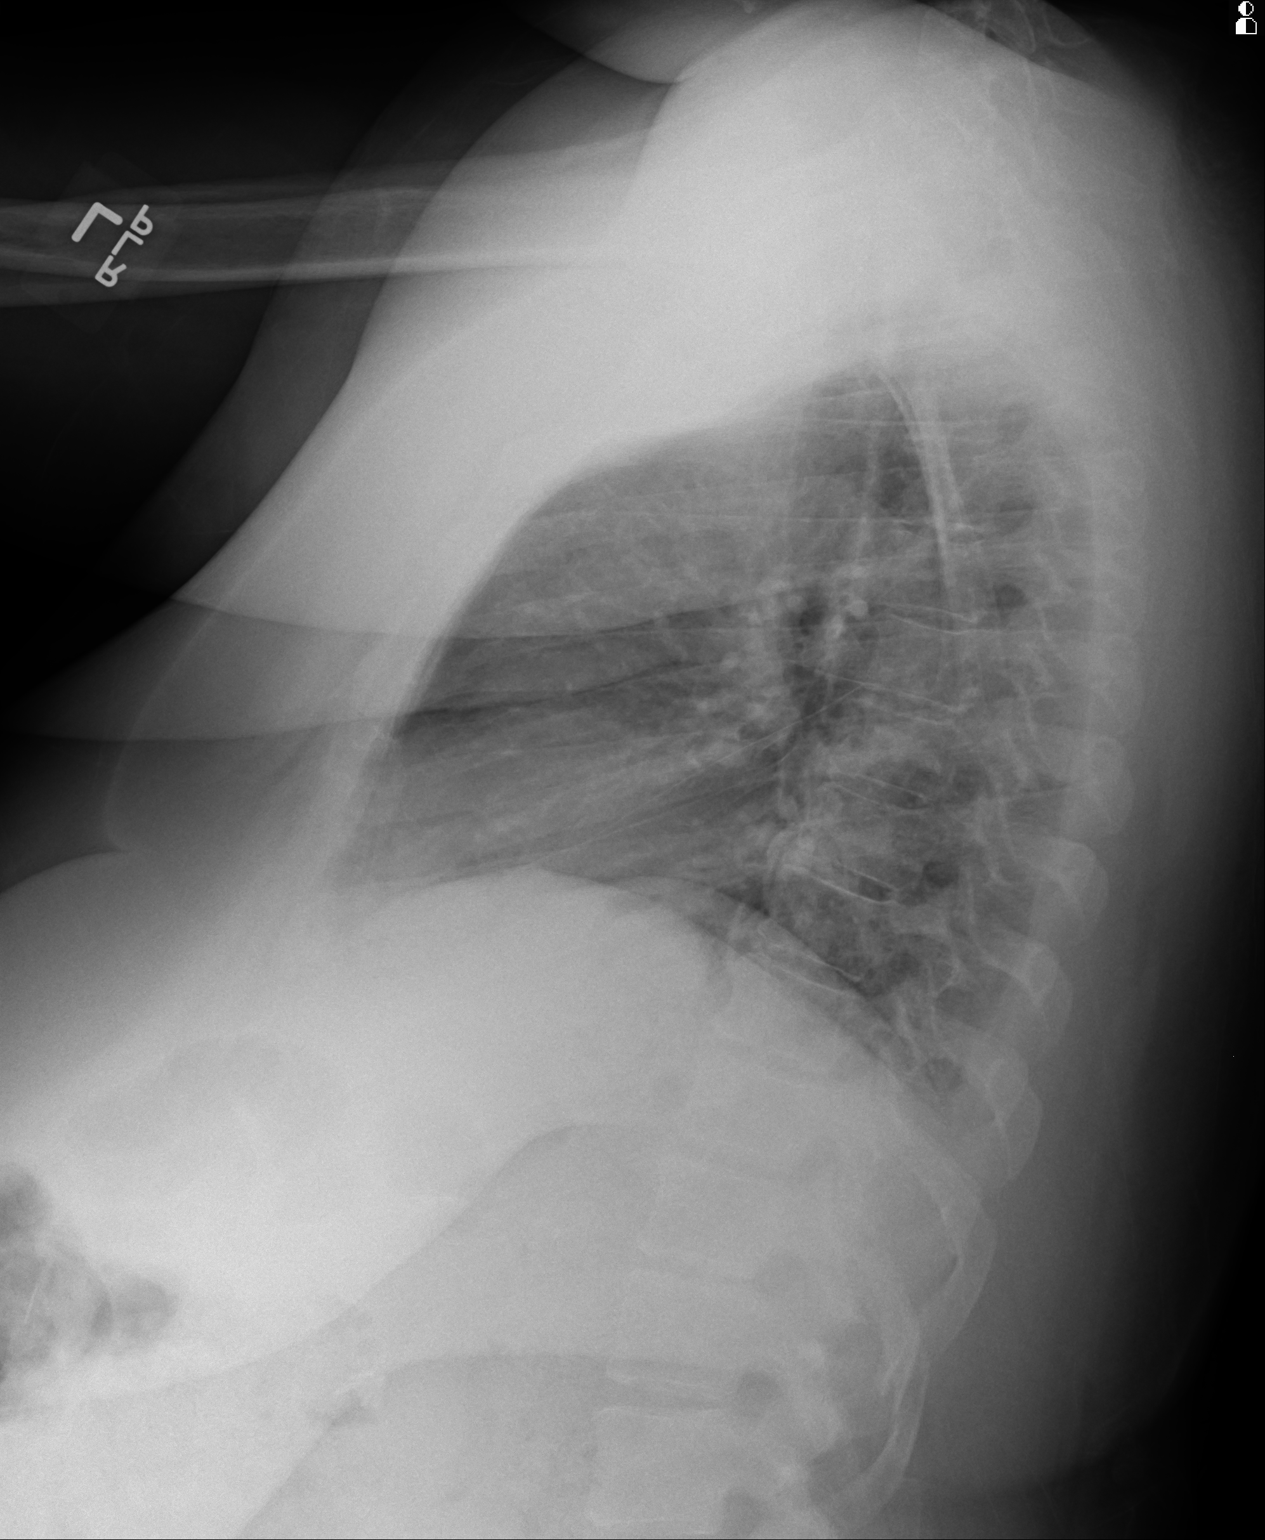

[2 of 2 positions shown; findings below may reference images not displayed]

FINDINGS: Lateral view degraded by patient arm position. Mild lower thoracic
spondylosis. Midline trachea. Normal heart size and mediastinal
contours. No pleural effusion or pneumothorax. Clear lungs.
IMPRESSION: No acute cardiopulmonary disease.

## 2014-10-28 IMAGING — CR DG SHOULDER 3+V*L*
1 series · 3 of 3 positions shown · non-contrast
Comparison: CT shoulder 03/01/2013 and left shoulder radiographs
03/01/2013

CLINICAL DATA: Shoulder fracture followup.  Fell again.

EXAM:
DG SHOULDER 3+VIEWS LEFT

[Series 1: grashey · 0.17mm/px · 3 of 3 slices shown]
[im 1/3]
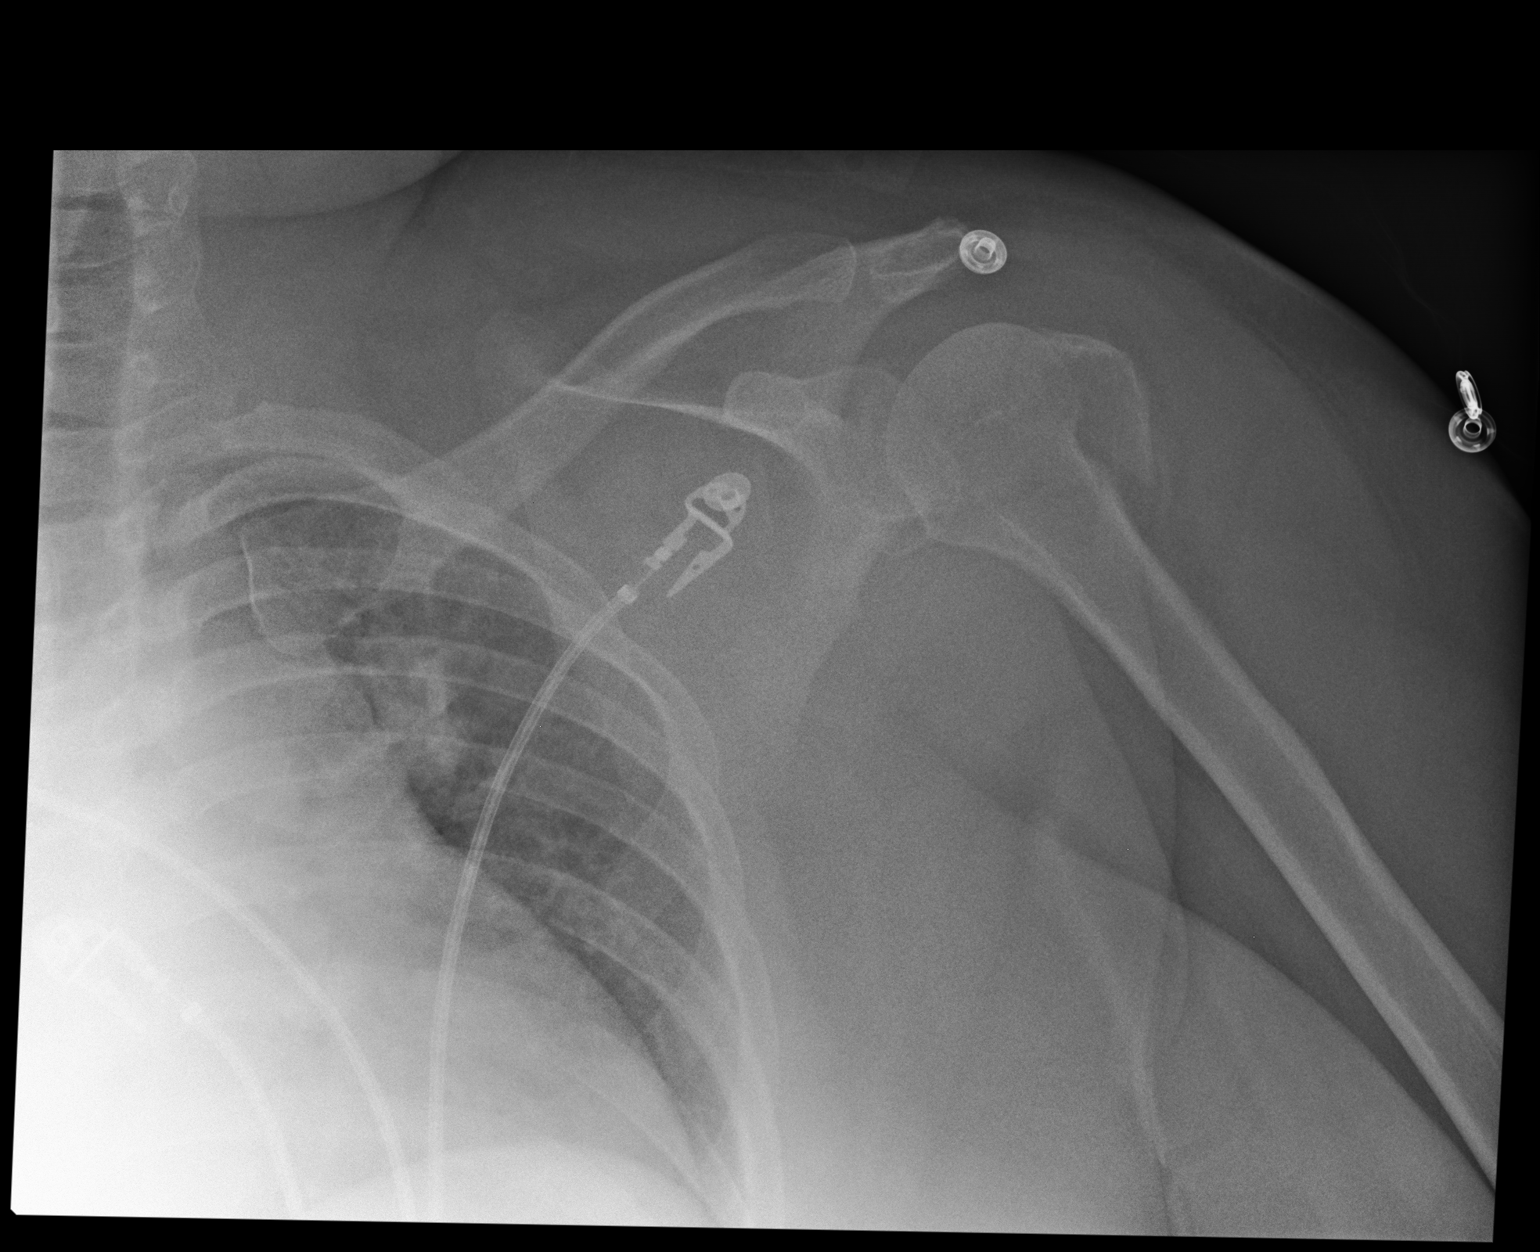
[im 2/3]
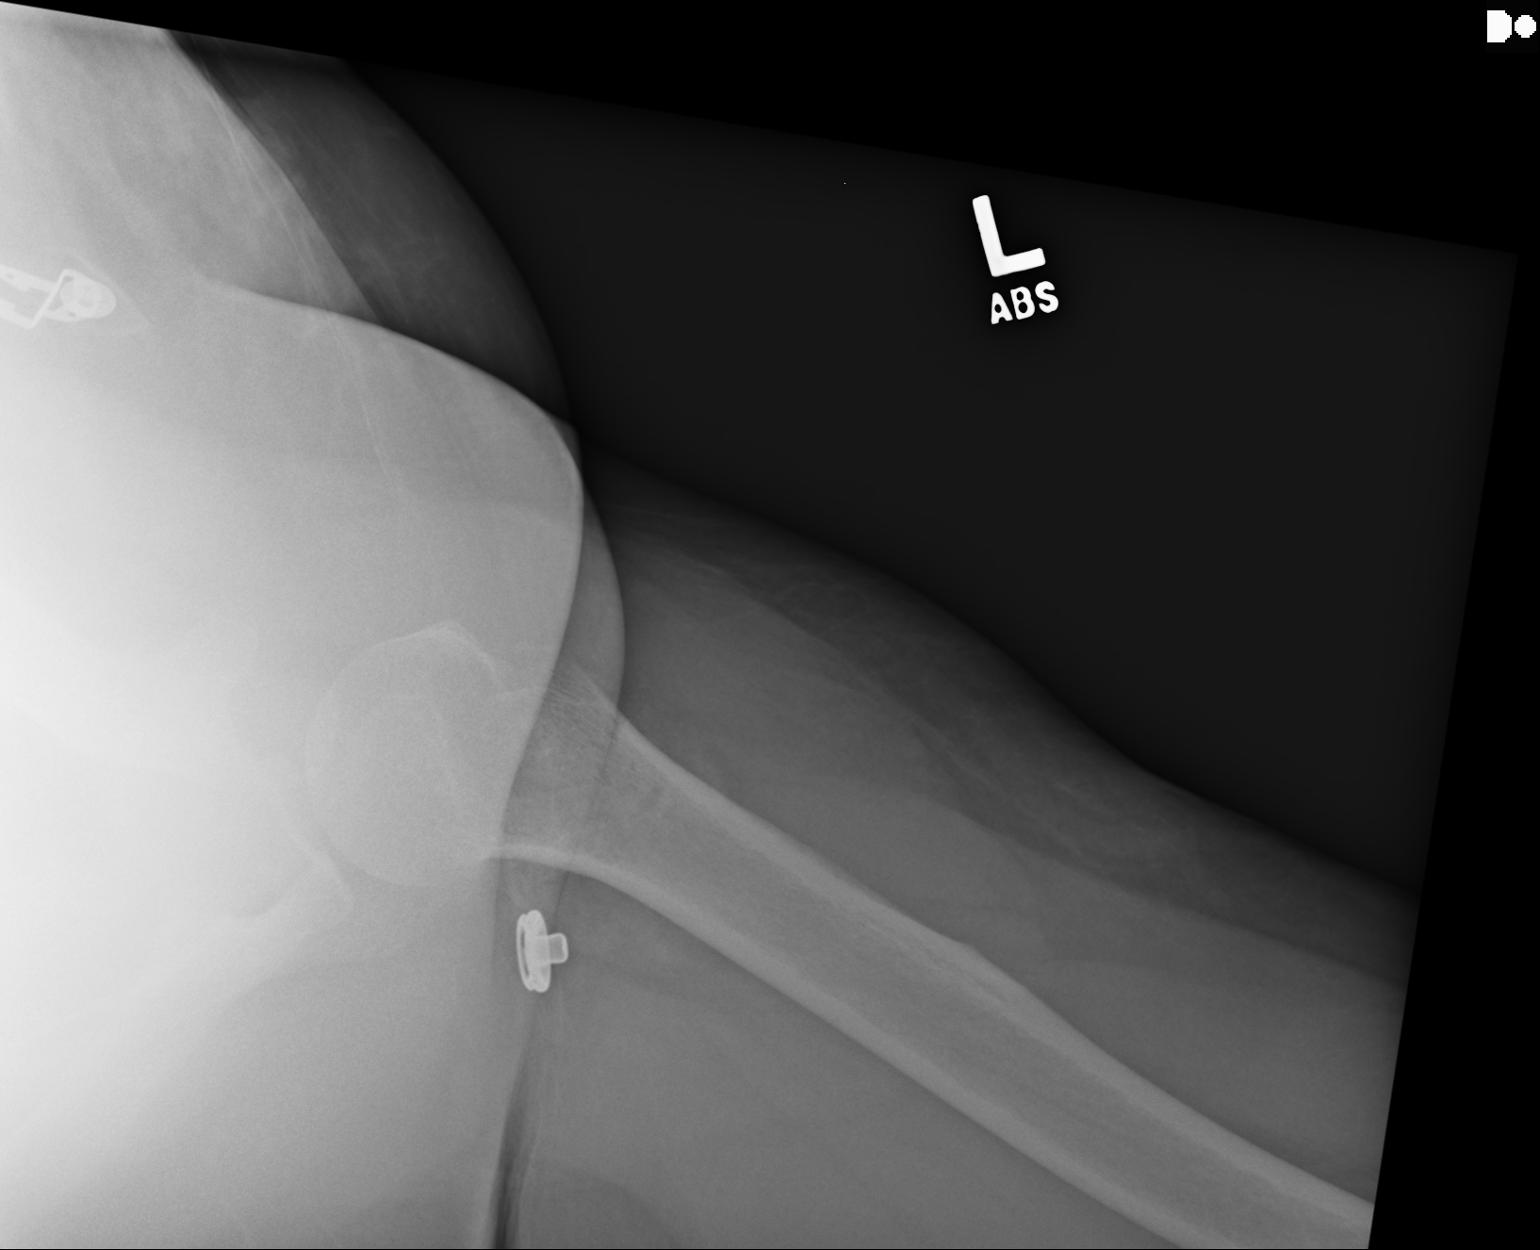
[im 3/3]
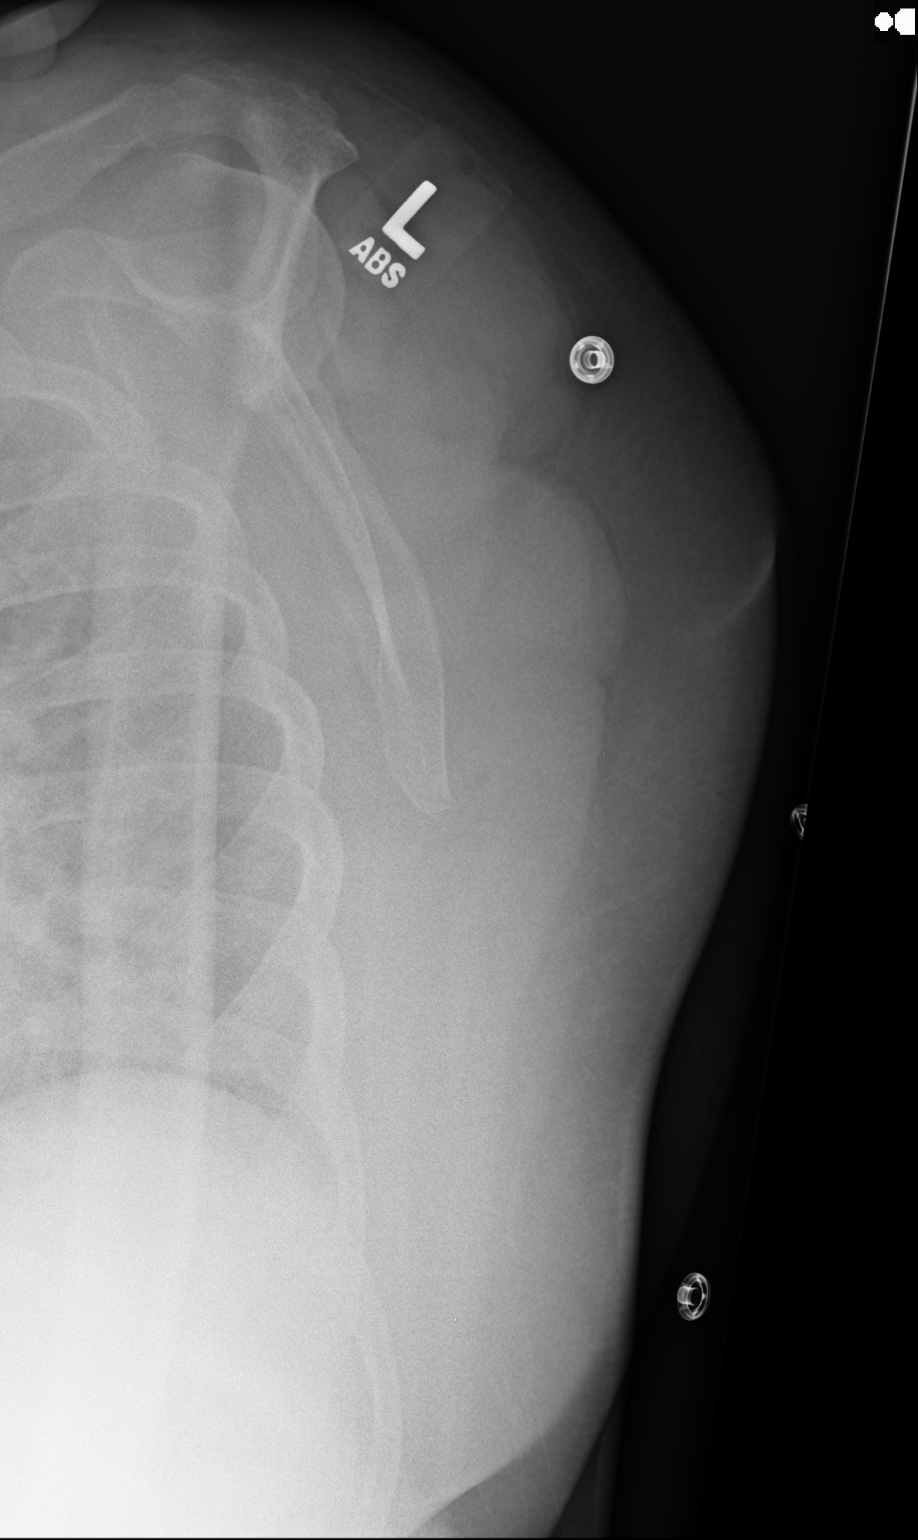

[3 of 3 positions shown; findings below may reference images not displayed]

FINDINGS: The left humeral head is located. Previously described fracture
through the greater tuberosity of the humerus appears without
significant change. Bony density seen just inferior to the left
glenoid is consistent with the known inferior glenoid fracture
fragment. The left clavicle is intact and acromioclavicular joint is
aligned.
IMPRESSION: No significant radiographic change in the appearance of the fracture
through the greater tuberosity of the left humerus. Inferior glenoid
fracture fragment is visualized, but best seen on prior radiographs
and prior CT. No definite acute bony fracture is seen compared to
recent studies dated 03/01/2013.

## 2014-10-30 ENCOUNTER — Telehealth: Payer: Self-pay | Admitting: *Deleted

## 2014-10-30 NOTE — Telephone Encounter (Signed)
Pt left voicemail stating that her Tizanidine was sent in incorrectly. She was previously taking 6 a day. It was sent in for once a day. She states that she usually gets 128 tablets. Please advise

## 2014-10-31 ENCOUNTER — Other Ambulatory Visit: Payer: Self-pay | Admitting: Nurse Practitioner

## 2014-10-31 MED ORDER — TIZANIDINE HCL 4 MG PO TABS: 4.0000 mg | ORAL_TABLET | Freq: Three times a day (TID) | ORAL | Status: DC

## 2014-10-31 NOTE — Telephone Encounter (Signed)
Ok to  Fill

## 2014-10-31 NOTE — Telephone Encounter (Signed)
Informed pt of medication directions, pt verbalized understanding

## 2014-10-31 NOTE — Telephone Encounter (Signed)
Max dosage for 4 mg is 3 x a day. Sent in this maximum dosage.

## 2014-11-06 ENCOUNTER — Ambulatory Visit: Payer: Medicare Other | Admitting: Nurse Practitioner

## 2014-11-12 ENCOUNTER — Telehealth: Payer: Self-pay | Admitting: *Deleted

## 2014-11-12 NOTE — Telephone Encounter (Signed)
Pharmacy from [facility] called requesting refill of Tramadol.  Spoke with local pharmacy, last refill 8.7.16.  Please advise

## 2014-11-13 NOTE — Telephone Encounter (Signed)
No refill. Too soon. Thanks for looking into last refill.

## 2014-11-20 ENCOUNTER — Ambulatory Visit: Payer: Medicare Other | Admitting: Neurology

## 2014-11-20 ENCOUNTER — Ambulatory Visit: Payer: Medicare Other | Admitting: Nurse Practitioner

## 2014-11-21 ENCOUNTER — Other Ambulatory Visit: Payer: Self-pay | Admitting: Nurse Practitioner

## 2014-11-21 ENCOUNTER — Telehealth: Payer: Self-pay | Admitting: *Deleted

## 2014-11-21 MED ORDER — TRAMADOL HCL 50 MG PO TABS: 50.0000 mg | ORAL_TABLET | Freq: Two times a day (BID) | ORAL | Status: DC

## 2014-11-21 NOTE — Telephone Encounter (Signed)
Last OV 7.6.16.  Please advise refill

## 2014-11-21 NOTE — Telephone Encounter (Signed)
Pt called requesting Tramadol refill.  Please advise refill

## 2014-11-21 NOTE — Telephone Encounter (Signed)
I will print out the script and sign to have her pick it up.  Pt can get filled on 11/25/14.

## 2014-11-21 NOTE — Telephone Encounter (Signed)
spoke with pt, she is in [place] now.  Pt aware refill will be sent however it is dated 9.6.16.  Pt verbalized understanding

## 2014-11-25 ENCOUNTER — Telehealth: Payer: Self-pay | Admitting: Nurse Practitioner

## 2014-11-25 ENCOUNTER — Other Ambulatory Visit: Payer: Self-pay | Admitting: Nurse Practitioner

## 2014-11-25 NOTE — Telephone Encounter (Signed)
She has cancelled her appointments for several times (and referrals) so I will not refill until she is seen in the office. Maximum dosage is 3 x daily according to my source, she cannot have 4 x a day.

## 2014-11-25 NOTE — Telephone Encounter (Signed)
Please advise 

## 2014-11-25 NOTE — Telephone Encounter (Signed)
Left detailed message on VM that pt needs to be seen before Xanaflex will be refilled.

## 2014-11-25 NOTE — Telephone Encounter (Signed)
Pt needs Zanflex prescribed the correct which is 4 times a day. Pt states it's not prescribed the correct way. Pt can pick up tomorrow. Pharmacy is [facility] in [address] [phone]. Thank You!

## 2014-11-26 ENCOUNTER — Ambulatory Visit (INDEPENDENT_AMBULATORY_CARE_PROVIDER_SITE_OTHER): Payer: Commercial Managed Care - HMO | Admitting: Nurse Practitioner

## 2014-11-26 VITALS — BP 152/102 | HR 84 | Temp 98.0°F | Resp 16 | Ht 60.0 in

## 2014-11-26 DIAGNOSIS — Z9119 Patient's noncompliance with other medical treatment and regimen: Secondary | ICD-10-CM | POA: Diagnosis not present

## 2014-11-26 DIAGNOSIS — G8929 Other chronic pain: Secondary | ICD-10-CM | POA: Diagnosis not present

## 2014-11-26 DIAGNOSIS — M545 Low back pain: Secondary | ICD-10-CM | POA: Diagnosis not present

## 2014-11-26 DIAGNOSIS — Z91199 Patient's noncompliance with other medical treatment and regimen due to unspecified reason: Secondary | ICD-10-CM

## 2014-11-26 MED ORDER — LISINOPRIL 10 MG PO TABS: 10.0000 mg | ORAL_TABLET | Freq: Every day | ORAL | Status: DC

## 2014-11-26 MED ORDER — TIZANIDINE HCL 4 MG PO TABS: 4.0000 mg | ORAL_TABLET | Freq: Four times a day (QID) | ORAL | Status: DC | PRN

## 2014-11-26 NOTE — Progress Notes (Signed)
Patient ID: [name], female    DOB: 1966/02/25  Age: 49 y.o. MRN: [id number]  CC: Follow-up   HPI [name] presents for hospital follow-up [place]. She is accompanied by her husband and in a wheelchair today.  1) Pt reports 10/20/14 seeing [facility] appointment with Neurology   Patient reports she has been out of town for 1 month   Pt reports she filled the script yesterday for tramadol (not in [place] yet).   2) [facility] [phone]  [place] [address]   11/15/14 hospital fall left side rib pain  X-ray chest, x-ray hip left, xray ribs left XR Lumbosacral  Muscle strain final diagnosis Patient main complaint today is about her muscle spasms and tizanidine prescription.  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   She has past surgical history that includes Gastric bypass; Partial hysterectomy; Knee surgery; Cholecystectomy; and Abdominal hysterectomy (08/11/2010).   Her family history includes Diabetes in her mother; Hypertension in her father; Stroke in her father and mother.She reports that she has never smoked. She has never used smokeless tobacco. She reports that she does not drink alcohol or use illicit drugs.  Outpatient Prescriptions Prior to Visit  Medication Sig Dispense Refill  . B-D ULTRAFINE III SHORT PEN 31G X 8 MM MISC USE TO ADMINISTER INSULIN FOUR TIMES DAILY AS DIRECTED (Patient not taking: Reported on 11/26/2014) 100 each 5  . gabapentin (NEURONTIN)  100 MG capsule Take 800 mg by mouth QID.    Marland Kitchen insulin aspart (NOVOLOG) 100 UNIT/ML injection Inject 10 Units into the skin 3 (three) times daily before meals. Sliding Scale    . insulin detemir (LEVEMIR) 100 UNIT/ML injection Inject 60 Units into the skin daily.    Marland Kitchen omeprazole (PRILOSEC) 20 MG capsule Take 20 mg by mouth 2 (two) times daily.    . ondansetron (ZOFRAN) 4 MG tablet Take 1 tablet (4 mg total) by mouth every 6 (six) hours as needed for nausea or vomiting. 20 tablet 1  . QUEtiapine (SEROQUEL) 400 MG tablet Take 800 mg by mouth at bedtime.    . sertraline (ZOLOFT) 100 MG tablet Take 100 mg by mouth daily.    . promethazine (PHENERGAN) 25 MG tablet TAKE 1 TABLET BY MOUTH EVERY 8 HOURS AS NEEDED FOR NAUSEA 30 tablet 0  . tiZANidine (ZANAFLEX) 4 MG tablet TAKE 1 TABLET(4 MG) BY MOUTH THREE TIMES DAILY 270 tablet 0  . traMADol (ULTRAM) 50 MG tablet Take 1 tablet (50 mg total) by mouth 2 (two) times daily. 60 tablet 0   No facility-administered medications prior to visit.    ROS Review of Systems  Constitutional: Negative for fever, chills, diaphoresis and fatigue.  Respiratory: Negative for chest tightness, shortness of breath and wheezing.   Cardiovascular: Negative for chest pain, palpitations and leg swelling.  Gastrointestinal: Negative for nausea, vomiting and diarrhea.  Musculoskeletal: Positive for myalgias and arthralgias.  Skin: Negative for rash.  Neurological: Negative for dizziness, weakness, numbness and headaches.  Psychiatric/Behavioral: The patient is not nervous/anxious.     Objective:  BP 152/102 mmHg  Pulse 84  Temp(Src) 98 F (36.7  C)  Resp 16  Ht 5' (1.524 m)  SpO2 98% Same BP on repeat  Physical Exam  Constitutional: She is oriented to person, place, and time. She appears well-developed and well-nourished. No distress.  HENT:  Head: Normocephalic and atraumatic.  Right Ear: External ear normal.  Left Ear: External ear normal.  Cardiovascular:  Normal rate, regular rhythm and normal heart sounds.   Pulmonary/Chest: Effort normal and breath sounds normal. No respiratory distress. She has no wheezes. She has no rales. She exhibits no tenderness.  Neurological: She is alert and oriented to person, place, and time. No cranial nerve deficit. She exhibits normal muscle tone. Coordination normal.  Skin: Skin is warm and dry. No rash noted. She is not diaphoretic.  Psychiatric: She has a normal mood and affect. Her behavior is normal. Judgment and thought content normal.   Assessment & Plan:   Kalley was seen today for follow-up.  Diagnoses and all orders for this visit:  Chronic low back pain  Non compliance with medical treatment  Other orders -     tiZANidine (ZANAFLEX) 4 MG tablet; Take 1 tablet (4 mg total) by mouth every 6 (six) hours as needed for muscle spasms. -     lisinopril (PRINIVIL,ZESTRIL) 10 MG tablet; Take 1 tablet (10 mg total) by mouth daily.  I have changed Ms. Vinas's tiZANidine. I am also having her start on lisinopril. Additionally, I am having her maintain her gabapentin, QUEtiapine, sertraline, omeprazole, insulin detemir, insulin aspart, ondansetron, and B-D ULTRAFINE III SHORT PEN.  Meds ordered this encounter  Medications  . tiZANidine (ZANAFLEX) 4 MG tablet    Sig: Take 1 tablet (4 mg total) by mouth every 6 (six) hours as needed for muscle spasms.    Dispense:  360 tablet    Refill:  0    **Patient requests 90 days supply**    Order Specific Question:  Supervising Provider    Answer:  Deborra Medina L [[id number]]  . lisinopril (PRINIVIL,ZESTRIL) 10 MG tablet    Sig: Take 1 tablet (10 mg total) by mouth daily.    Dispense:  90 tablet    Refill:  3    Order Specific Question:  Supervising Provider    Answer:  Crecencio Mc [[id number]]     Follow-up: Return in about 2 weeks (around 12/10/2014) for HTN.

## 2014-11-26 NOTE — Progress Notes (Signed)
Pre visit review using our clinic review tool, if applicable. No additional management support is needed unless otherwise documented below in the visit note. 

## 2014-11-26 NOTE — Patient Instructions (Addendum)
Tizanidine called into your pharmacy for muscle spasms.   Lisinopril 10 mg sent to pharmacy to start for your blood pressure.   Follow up in 2 weeks for check on your high blood pressure.

## 2014-12-02 ENCOUNTER — Ambulatory Visit: Payer: Medicare Other | Admitting: Dietician

## 2014-12-04 ENCOUNTER — Ambulatory Visit: Payer: Medicare Other | Admitting: Nurse Practitioner

## 2014-12-04 ENCOUNTER — Other Ambulatory Visit: Payer: Self-pay | Admitting: Nurse Practitioner

## 2014-12-04 NOTE — Telephone Encounter (Signed)
Please advise refill? 

## 2014-12-05 ENCOUNTER — Telehealth: Payer: Self-pay

## 2014-12-05 ENCOUNTER — Telehealth: Payer: Self-pay | Admitting: Nurse Practitioner

## 2014-12-05 NOTE — Telephone Encounter (Signed)
Patient dismissal Process started in HIM.

## 2014-12-05 NOTE — Telephone Encounter (Signed)
Patient dismissed from [facility] by Lorane Gell NP-C , effective December 05, 2014. Dismissal letter sent out by certified / registered mail. DAJ  Received signed domestic return receipt verifying delivery of certified letter on December 08, 2014. Article number [id number] [place]

## 2014-12-07 ENCOUNTER — Encounter: Payer: Self-pay | Admitting: Nurse Practitioner

## 2014-12-07 ENCOUNTER — Encounter: Payer: Self-pay | Admitting: *Deleted

## 2014-12-07 ENCOUNTER — Emergency Department
Admission: EM | Admit: 2014-12-07 | Discharge: 2014-12-07 | Disposition: A | Discharge status: Home / Self Care | Payer: Commercial Managed Care - HMO | Attending: Emergency Medicine | Admitting: Emergency Medicine

## 2014-12-07 ENCOUNTER — Emergency Department: Payer: Commercial Managed Care - HMO

## 2014-12-07 DIAGNOSIS — Z794 Long term (current) use of insulin: Secondary | ICD-10-CM | POA: Insufficient documentation

## 2014-12-07 DIAGNOSIS — W01198A Fall on same level from slipping, tripping and stumbling with subsequent striking against other object, initial encounter: Secondary | ICD-10-CM | POA: Insufficient documentation

## 2014-12-07 DIAGNOSIS — S29012A Strain of muscle and tendon of back wall of thorax, initial encounter: Secondary | ICD-10-CM | POA: Diagnosis not present

## 2014-12-07 DIAGNOSIS — Y9289 Other specified places as the place of occurrence of the external cause: Secondary | ICD-10-CM | POA: Diagnosis not present

## 2014-12-07 DIAGNOSIS — E119 Type 2 diabetes mellitus without complications: Secondary | ICD-10-CM | POA: Diagnosis not present

## 2014-12-07 DIAGNOSIS — S29019A Strain of muscle and tendon of unspecified wall of thorax, initial encounter: Secondary | ICD-10-CM

## 2014-12-07 DIAGNOSIS — Z880 Allergy status to penicillin: Secondary | ICD-10-CM | POA: Diagnosis not present

## 2014-12-07 DIAGNOSIS — Z79899 Other long term (current) drug therapy: Secondary | ICD-10-CM | POA: Insufficient documentation

## 2014-12-07 DIAGNOSIS — Y998 Other external cause status: Secondary | ICD-10-CM | POA: Diagnosis not present

## 2014-12-07 DIAGNOSIS — S299XXA Unspecified injury of thorax, initial encounter: Secondary | ICD-10-CM | POA: Diagnosis present

## 2014-12-07 DIAGNOSIS — S20212A Contusion of left front wall of thorax, initial encounter: Secondary | ICD-10-CM | POA: Diagnosis not present

## 2014-12-07 DIAGNOSIS — Z91199 Patient's noncompliance with other medical treatment and regimen due to unspecified reason: Secondary | ICD-10-CM | POA: Insufficient documentation

## 2014-12-07 DIAGNOSIS — Y9389 Activity, other specified: Secondary | ICD-10-CM | POA: Diagnosis not present

## 2014-12-07 DIAGNOSIS — Z9119 Patient's noncompliance with other medical treatment and regimen: Secondary | ICD-10-CM | POA: Insufficient documentation

## 2014-12-07 MED ORDER — TRAMADOL HCL 50 MG PO TABS
50.0000 mg | ORAL_TABLET | Freq: Once | ORAL | Status: AC
  Administered 2014-12-07: 50 mg via ORAL
  Filled 2014-12-07: qty 1

## 2014-12-07 MED ORDER — TRAMADOL HCL 50 MG PO TABS: 50.0000 mg | ORAL_TABLET | Freq: Four times a day (QID) | ORAL | Status: DC | PRN

## 2014-12-07 NOTE — Discharge Instructions (Signed)
Chest Contusion A contusion is a deep bruise. Bruises happen when an injury causes bleeding under the skin. Signs of bruising include pain, puffiness (swelling), and discolored skin. The bruise may turn blue, purple, or yellow.  HOME CARE  Put ice on the injured area.  Put ice in a plastic bag.  Place a towel between the skin and the bag.  Leave the ice on for 15-20 minutes at a time, 03-04 times a day for the first 48 hours.  Only take medicine as told by your doctor.  Rest.  Take deep breaths (deep-breathing exercises) as told by your doctor.  Stop smoking if you smoke.  Do not lift objects over 5 pounds (2.3 kilograms) for 3 days or longer if told by your doctor. GET HELP RIGHT AWAY IF:   You have more bruising or puffiness.  You have pain that gets worse.  You have trouble breathing.  You are dizzy, weak, or pass out (faint).  You have blood in your pee (urine) or poop (stool).  You cough up or throw up (vomit) blood.  Your puffiness or pain is not helped with medicines. MAKE SURE YOU:   Understand these instructions.  Will watch your condition.  Will get help right away if you are not doing well or get worse. Document Released: 08/24/2007 Document Revised: 11/30/2011 Document Reviewed: 08/29/2011 [facility] Patient Information 2015 [place], [place]. This information is not intended to replace advice given to you by your health care provider. Make sure you discuss any questions you have with your health care provider.   FOLLOW UP WITH YOUR DOCTOR FOR ANY CONTINUED PAIN MEDICATIONS CURRENTLY YOU ARE ON ZANAFLEX WHICH IS A MUSCLE RELAXANT  ICE TO RIBS AND BACK

## 2014-12-07 NOTE — ED Notes (Signed)
AAOx3.  Skin warm and dry.  Ambulates with easy and steady gait. NAD 

## 2014-12-07 NOTE — ED Provider Notes (Signed)
[facility] Emergency Department [name] Note  ____________________________________________  Time seen: Approximately 1:47 PM  I have reviewed the triage vital signs and the nursing notes.   HISTORY  Chief Complaint Rib Injury and Back Pain   HPI [name] is a 49 y.o. female patient is here with complaint of left rib pain and mid back pain since falling approximately one month ago while having seizure. She states that she was checked out of the hospital while she was out of town and told that these were just bruises and strains. She fell again 2 days ago on the same area due to muscle spasms. She states that she cannot take Tylenol due to her allergies. She continues to take gabapentin for neuropathy. Currently her pain is reported as 10 out of 10.  Past Medical History  Diagnosis Date  . Diabetes mellitus without complication   . Hypercholesteremia   . Bipolar 1 disorder   . Arthritis   . Depression   . Blood transfusion without reported diagnosis   . Chicken pox   . History of stomach ulcers   . History of fainting spells of unknown cause   . Diabetes mellitus, type 2 11/12/2010  . 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 Alease Frame   . Disorder of peripheral nervous system 11/12/2010  . Fibroid 11/12/2010    Overview:  Uterine fibroids with menorrhagia.  Fe def anemia.  GYN: Dr. Robina Ade.  Robotically-assisted total laparoscopic hysterectomy, aspiration of left ovarian cyst, cystoscopy. 08/11/09@[facility] .   . Back pain 09/08/2014  . Apnea, sleep 11/12/2010    Overview:  Overview:  A.  Sleep apnea.  Stated prev was diagnosed with sleep apnea in [place], [place] but moved to [place] and did not follow through with additional testing. ++++++++++++++++++++++++++++++++++++++ November 13, 2011 NPSG @ [facility] Sleep [facility]  overall AHI 2/hr; O2 nadir 83%. No supine sleep captured; side AHI: 1.9/hr; REM AHI 17.4/hr. ESS:  9/24; BMI 53.9 kg/m2; Wei    Patient Active Problem List   Diagnosis Date Noted  . Chronic low back pain 10/04/2014  . Encounter to establish care 09/08/2014  . Back pain 09/08/2014  . Diabetes 09/08/2014  . Bipolar disorder 09/08/2014  . Avitaminosis D 12/14/2011  . Polypharmacy 11/16/2011  . Awareness of heartbeats 10/21/2011  . Fast heart beat 10/21/2011  . Epilepsy, localization-related 07/29/2011  . Cyst of pancreas 11/12/2010  . Panic 11/12/2010  . Disorder of peripheral nervous system 11/12/2010  . Apnea, sleep 11/12/2010  . Diabetes mellitus, type 2 11/12/2010  . Fibroid 11/12/2010    Past Surgical History  Procedure Laterality Date  . Gastric bypass    . Partial hysterectomy    . Knee surgery    . Cholecystectomy    . Abdominal hysterectomy  08/11/2010    Partial    Current Outpatient Rx  Name  Route  Sig  Dispense  Refill  . B-D ULTRAFINE III SHORT PEN 31G X 8 MM MISC      USE TO ADMINISTER INSULIN FOUR TIMES DAILY AS DIRECTED Patient not taking: Reported on 11/26/2014   100 each   5   . gabapentin (NEURONTIN) 100 MG capsule   Oral   Take 800 mg by mouth QID.         Marland Kitchen insulin aspart (NOVOLOG) 100 UNIT/ML injection   Subcutaneous   Inject 10 Units into the skin 3 (three) times daily before meals. Sliding Scale         .  insulin detemir (LEVEMIR) 100 UNIT/ML injection   Subcutaneous   Inject 60 Units into the skin daily.         Marland Kitchen lisinopril (PRINIVIL,ZESTRIL) 10 MG tablet   Oral   Take 1 tablet (10 mg total) by mouth daily.   90 tablet   3   . omeprazole (PRILOSEC) 20 MG capsule   Oral   Take 20 mg by mouth 2 (two) times daily.         . ondansetron (ZOFRAN) 4 MG tablet   Oral   Take 1 tablet (4 mg total) by mouth every 6 (six) hours as needed for nausea or vomiting.   20 tablet   1   . promethazine (PHENERGAN) 25 MG tablet      TAKE 1 TABLET BY MOUTH EVERY 8 HOURS AS NEEDED FOR NAUSEA   30 tablet   0   . QUEtiapine (SEROQUEL)  400 MG tablet   Oral   Take 800 mg by mouth at bedtime.         . sertraline (ZOLOFT) 100 MG tablet   Oral   Take 100 mg by mouth daily.         Marland Kitchen tiZANidine (ZANAFLEX) 4 MG tablet   Oral   Take 1 tablet (4 mg total) by mouth every 6 (six) hours as needed for muscle spasms.   360 tablet   0     **Patient requests 90 days supply**   . traMADol (ULTRAM) 50 MG tablet   Oral   Take 1 tablet (50 mg total) by mouth every 6 (six) hours as needed.   10 tablet   0     Allergies Penicillins; Lyrica; Morphine and related; Reglan; and Tylenol  Family History  Problem Relation Age of Onset  . Stroke Mother   . Diabetes Mother   . Stroke Father   . Hypertension Father     Social History Social History  Substance Use Topics  . Smoking status: Never Smoker   . Smokeless tobacco: Never Used  . Alcohol Use: No    Review of Systems Constitutional: No fever/chills Eyes: No visual changes. ENT: No sore throat. Cardiovascular: Left rib pain Respiratory: Denies shortness of breath. Gastrointestinal: No abdominal pain.  No nausea, no vomiting.  No diarrhea.  No constipation. Genitourinary: Negative for dysuria. Musculoskeletal: Positive mid back pain Skin: Negative for rash. Neurological: Negative for headaches, focal weakness or numbness.  10-point ROS otherwise negative.  ____________________________________________   PHYSICAL EXAM:  VITAL SIGNS: ED Triage Vitals  Enc Vitals Group     BP 12/07/14 1301 115/76 mmHg     Pulse Rate 12/07/14 1301 96     Resp 12/07/14 1301 18     Temp 12/07/14 1301 98.4 F (36.9 C)     Temp Source 12/07/14 1301 Oral     SpO2 12/07/14 1301 99 %     Weight 12/07/14 1301 230 lb (104.327 kg)     Height 12/07/14 1301 5' (1.524 m)     Head Cir --      Peak Flow --      Pain Score 12/07/14 1302 10     Pain Loc --      Pain Edu? --      Excl. in [place]? --     Constitutional: Alert and oriented. Well appearing and in no acute distress.  Patient is currently sitting in wheelchair, she is able to ambulate on her own and stood during exam Eyes: Conjunctivae are normal.  PERRL. EOMI. Head: Atraumatic. Nose: No congestion/rhinnorhea. Neck: No stridor.  No cervical tenderness on palpation Cardiovascular: Normal rate, regular rhythm. Grossly normal heart sounds.  Good peripheral circulation. Respiratory: Normal respiratory effort.  No retractions. Lungs CTAB. Moderate tenderness on palpation of the left lateral ribs. No gross deformity was noted. No ecchymosis or abrasions were noted. Gastrointestinal: Soft and nontender. No distention. No abdominal bruits. No CVA tenderness. Musculoskeletal: Thoracic spine and paravertebral muscles with minimal tenderness. Range of motion is restricted second patient's pain. No active muscle spasms were seen. No lower extremity tenderness nor edema.  No joint effusions. Neurologic:  Normal speech and language. No gross focal neurologic deficits are appreciated. No gait instability. Skin:  Skin is warm, dry and intact. No rash noted. No ecchymosis or abrasions were noted Psychiatric: Mood and affect are normal. Speech and behavior are normal.  ____________________________________________   LABS (all labs ordered are listed, but only abnormal results are displayed)  Labs Reviewed - No data to display RADIOLOGY  Left rib x-ray per radiologist and reviewed by me was no visible left rib fracture. Thoracic spine x-ray per radiologist showed normal alignment and no acute bony findings. Stable age advanced degenerative changes. ____________________________________________   PROCEDURES  Procedure(s) performed: None  Critical Care performed: No  ____________________________________________   INITIAL IMPRESSION / ASSESSMENT AND PLAN / ED COURSE  Pertinent labs & imaging results that were available during my care of the patient were reviewed by me and considered in my medical decision making (see  chart for details).  Patient was given a prescription for tramadol No. 10 for pain control until she can discuss continued pain medication with her PCP. She states she left her prescription of tramadol at a family member's house who was supposed to be mailing it to her. ____________________________________________   FINAL CLINICAL IMPRESSION(S) / ED DIAGNOSES  Final diagnoses:  Contusion of ribs, left, initial encounter  Thoracic myofascial strain, initial encounter      Johnn Hai, PA-C 12/07/14 1509  Ahmed Prima, MD 12/07/14 1524

## 2014-12-07 NOTE — ED Notes (Signed)
Pt states she had a seizure 4 weeks ago and fell hitting her left side, states left sided rib pain radiating to her back, states she fell again Friday due to a pain spasm

## 2014-12-07 NOTE — Assessment & Plan Note (Signed)
Tizanidine filled again for muscle spasms. Pt is very non-compliant (See diagnosis). Asked her to regularly follow up for care. FU in 2 weeks.

## 2014-12-07 NOTE — Assessment & Plan Note (Signed)
Patient is non-compliant with discussed medical treatment. She agrees to follow through with referrals for specialty care and pain management, but fails to maintain or no-shows to these appointments. She inappropriately calls early for her tramadol refill. She cancels and reschedules many appointments with myself and only shows when she is out of medication and we refuse to fill it.

## 2014-12-08 ENCOUNTER — Telehealth: Payer: Self-pay | Admitting: *Deleted

## 2014-12-08 NOTE — Telephone Encounter (Signed)
Pt called wanting to confirm Friday, 9.23.16, appoint and to discuss the letter received.  Spoke with Morey Hummingbird and Izora Gala, will not return call until advised.

## 2014-12-09 ENCOUNTER — Other Ambulatory Visit: Payer: Self-pay | Admitting: Nurse Practitioner

## 2014-12-10 ENCOUNTER — Telehealth: Payer: Self-pay | Admitting: Nurse Practitioner

## 2014-12-10 ENCOUNTER — Encounter: Payer: Self-pay | Admitting: Nurse Practitioner

## 2014-12-10 ENCOUNTER — Telehealth: Payer: Self-pay | Admitting: *Deleted

## 2014-12-10 ENCOUNTER — Ambulatory Visit: Payer: Medicare Other | Admitting: Dietician

## 2014-12-10 NOTE — Telephone Encounter (Signed)
Please let patient know that she will not be seeing me on Friday as we said on the phone.

## 2014-12-10 NOTE — Telephone Encounter (Signed)
Approximately 4:20 pm Ms. Decock returned our request to call. We had a speaker phone conversation and announced that [place] and myself were on the call. She began to tell me about her recent ER visit and fall, which we had previously discussed. I asked her if she had questions about the dismissal letter and she stated she had not missed appointments and spoke with Rasheeda on the phone to cancel. I reported that there were multiple cancellations and no show visits, which as part of our policy she had missed too many. I said that her no showing to referral visits impacted our care for her and because she was not actively participating the care plan. I told her we will be happy to take care of her medications for 30 days ending 01/06/15. I told the patient I want her to fill the ED Tramadol prescription first and that I would fax the difference for the rest of the month to the pharmacy. I referenced the phone number on the letter where she can call for finding another Rafan Sanders. She verbalized understanding of all that was said and thanked [place] for our time.

## 2014-12-10 NOTE — Telephone Encounter (Signed)
Pt called requesting to confirm appoint on 9.23.16 with [facility].  Please advise

## 2014-12-10 NOTE — Telephone Encounter (Signed)
Spoke with pt, advised there is no appoint on 9.23.16.  Pt verbalized understanding

## 2014-12-12 ENCOUNTER — Ambulatory Visit: Payer: Medicare Other | Admitting: Nurse Practitioner

## 2014-12-17 ENCOUNTER — Other Ambulatory Visit: Payer: Self-pay | Admitting: Nurse Practitioner

## 2014-12-17 NOTE — Telephone Encounter (Signed)
Last OV 9.716, no show 5 times, no OV scheduled. OV scheduled with Dr Dossie Arbour 11.7/.[place]

## 2014-12-18 ENCOUNTER — Other Ambulatory Visit: Payer: Self-pay | Admitting: Nurse Practitioner

## 2014-12-18 NOTE — Telephone Encounter (Signed)
Pt called about needing her tramadol, phenegan and tizandine medication refilled. Pt states pharmacy states that they sent 2. Pharmacy is [facility] in [place]. Thank You!

## 2014-12-18 NOTE — Telephone Encounter (Signed)
Morey Hummingbird, Please advise refill?

## 2014-12-31 ENCOUNTER — Ambulatory Visit (INDEPENDENT_AMBULATORY_CARE_PROVIDER_SITE_OTHER): Payer: Commercial Managed Care - HMO | Admitting: Family Medicine

## 2014-12-31 ENCOUNTER — Encounter: Payer: Self-pay | Admitting: Family Medicine

## 2014-12-31 VITALS — BP 138/88 | HR 123 | Temp 98.9°F | Resp 18 | Wt 235.3 lb

## 2014-12-31 DIAGNOSIS — M545 Low back pain: Secondary | ICD-10-CM | POA: Diagnosis not present

## 2014-12-31 DIAGNOSIS — M5137 Other intervertebral disc degeneration, lumbosacral region: Secondary | ICD-10-CM

## 2014-12-31 DIAGNOSIS — Z794 Long term (current) use of insulin: Secondary | ICD-10-CM | POA: Diagnosis not present

## 2014-12-31 DIAGNOSIS — R110 Nausea: Secondary | ICD-10-CM | POA: Diagnosis not present

## 2014-12-31 DIAGNOSIS — E1165 Type 2 diabetes mellitus with hyperglycemia: Secondary | ICD-10-CM

## 2014-12-31 DIAGNOSIS — IMO0001 Reserved for inherently not codable concepts without codable children: Secondary | ICD-10-CM

## 2014-12-31 DIAGNOSIS — G4700 Insomnia, unspecified: Secondary | ICD-10-CM | POA: Insufficient documentation

## 2014-12-31 DIAGNOSIS — G8929 Other chronic pain: Secondary | ICD-10-CM

## 2014-12-31 DIAGNOSIS — G40109 Localization-related (focal) (partial) symptomatic epilepsy and epileptic syndromes with simple partial seizures, not intractable, without status epilepticus: Secondary | ICD-10-CM | POA: Diagnosis not present

## 2014-12-31 MED ORDER — ONDANSETRON HCL 8 MG PO TABS: 8.0000 mg | ORAL_TABLET | Freq: Three times a day (TID) | ORAL | Status: DC | PRN

## 2014-12-31 MED ORDER — TIZANIDINE HCL 4 MG PO TABS: 4.0000 mg | ORAL_TABLET | Freq: Four times a day (QID) | ORAL | Status: AC | PRN

## 2014-12-31 NOTE — Progress Notes (Signed)
Name: [name]   MRN: [id number]    DOB: [birth year]   Date:12/31/2014       Progress Note  Subjective  Chief Complaint  Chief Complaint  Patient presents with  . Establish Care  . Medication Refill  . Referral    the local pain clinic    HPI  [name] is a 50 y.o. female here today to transition care of medical needs to a primary care provider. Patient complains of arthralgias and back pain for which has been present for several years. Pain is located in lower back, is described as aching, and is intermittent, moderate .  Associated symptoms include: decreased range of motion.  The patient has tried heat, NSAIDs, position change, rest and gabapentin, tramadol for pain, with moderate relief.  Related to injury:   No. Requesting referral to pain management specialist today.  She also reports chronic nausea on 2 different nausea medications. Long history of mental health issues. Has "seizure like" facial tics but does not report seeing a neurologist recently.  She reports being closer to 300 lbs in the past and has lost weight, prefers not to be on blood pressure medication due to misconception (she feels blood pressure medication decreased her mother's kidney function). Diabetes is not well controled. She is not on metformin or oral agents. She states Metformin previously caused GI intolerance. Otherwise she has never been offered any newer oral agents to help manage her diabetes. She is reluctant to use ACE i or BB. She states she has hypoglycemic events, sugars as low as 15 and was hospitalized. Otherwise highs up to 175.      Past Medical History  Diagnosis Date  . Diabetes mellitus without complication ([place])   . Hypercholesteremia   . Bipolar 1 disorder ([place])   . Arthritis   . Depression   . Blood transfusion without reported diagnosis   . Chicken pox   . History of stomach ulcers   . History of fainting spells of unknown cause   . Diabetes mellitus, type 2 ([place])  11/12/2010  . 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 Alease Frame   . Disorder of peripheral nervous system ([place]) 11/12/2010  . Fibroid 11/12/2010    Overview:  Uterine fibroids with menorrhagia.  Fe def anemia.  GYN: Dr. Robina Ade.  Robotically-assisted total laparoscopic hysterectomy, aspiration of left ovarian cyst, cystoscopy. 08/11/09@[facility] .   . Back pain 09/08/2014  . Apnea, sleep 11/12/2010    Overview:  Overview:  A.  Sleep apnea.  Stated prev was diagnosed with sleep apnea in [place], [place] but moved to [place] and did not follow through with additional testing. ++++++++++++++++++++++++++++++++++++++ November 13, 2011 NPSG @ [facility]  overall AHI 2/hr; O2 nadir 83%. No supine sleep captured; side AHI: 1.9/hr; REM AHI 17.4/hr. ESS: 9/24; BMI 53.9 kg/m2; Wei    Patient Active Problem List   Diagnosis Date Noted  . Non compliance with medical treatment 12/07/2014  . Chronic low back pain 10/04/2014  . Encounter to establish care 09/08/2014  . Back pain 09/08/2014  . Diabetes ([place]) 09/08/2014  . Bipolar disorder ([place]) 09/08/2014  . Bipolar affective disorder ([place]) 04/08/2013  . Avitaminosis D 12/14/2011  . Polypharmacy 11/16/2011  . Other long term (current) drug therapy 11/16/2011  . Awareness of heartbeats 10/21/2011  . Fast heart beat 10/21/2011  . Encounter for other preprocedural examination 10/21/2011  . 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 Cyst of pancreas 11/12/2010  . Panic 11/12/2010  . Disorder of peripheral nervous system ([place]) 11/12/2010  . Apnea, sleep 11/12/2010  . Diabetes mellitus, type 2 ([place]) 11/12/2010  . Fibroid 11/12/2010  . 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 Type 2 diabetes mellitus ([place]) 11/12/2010    Social History  Substance Use Topics  . Smoking status: Never Smoker   . Smokeless tobacco: Never Used  . Alcohol Use: No      Current outpatient prescriptions:  .  B-D ULTRAFINE III SHORT PEN 31G X 8 MM MISC, USE TO ADMINISTER INSULIN FOUR TIMES DAILY AS DIRECTED, Disp: 100 each, Rfl: 5 .  gabapentin (NEURONTIN) 800 MG tablet, , Disp: , Rfl:  .  insulin aspart (NOVOLOG) 100 UNIT/ML injection, Inject 10 Units into the skin 3 (three) times daily before meals. Sliding Scale, Disp: , Rfl:  .  insulin detemir (LEVEMIR) 100 UNIT/ML injection, Inject 60 Units into the skin daily., Disp: , Rfl:  .  lamoTRIgine (LAMICTAL) 100 MG tablet, , Disp: , Rfl:  .  omeprazole (PRILOSEC) 20 MG capsule, Take 20 mg by mouth 2 (two) times daily., Disp: , Rfl:  .  ondansetron (ZOFRAN) 4 MG tablet, Take 1 tablet (4 mg total) by mouth every 6 (six) hours as needed for nausea or vomiting., Disp: 20 tablet, Rfl: 1 .  promethazine (PHENERGAN) 25 MG tablet, TAKE 1 TABLET BY MOUTH EVERY 8 HOURS AS NEEDED FOR NAUSEA, Disp: 30 tablet, Rfl: 0 .  QUEtiapine (SEROQUEL) 400 MG tablet, Take 800 mg by mouth at bedtime., Disp: , Rfl:  .  sertraline (ZOLOFT) 100 MG tablet, Take 100 mg by mouth daily., Disp: , Rfl:  .  tiZANidine (ZANAFLEX) 4 MG tablet, Take 1 tablet (4 mg total) by mouth every 6 (six) hours as needed for muscle spasms., Disp: 360 tablet, Rfl: 0 .  traMADol (ULTRAM) 50 MG tablet, TAKE 1 TABLET BY MOUTH TWICE DAILY (Patient not taking: Reported on 12/31/2014), Disp: 60 tablet, Rfl: 0  Past Surgical History  Procedure Laterality Date  . Gastric bypass    . Partial hysterectomy    . Knee surgery    . Cholecystectomy    . Abdominal hysterectomy  08/11/2010    Partial    Family History  Problem Relation Age of Onset  . Stroke Mother   . Diabetes Mother   . Stroke Father   . Hypertension Father     Allergies  Allergen Reactions  . Penicillins Anaphylaxis  . Lyrica [Pregabalin]     Shaking, couldn't talk. Possbile seizure per pt.  . Morphine And Related Itching    Just morphine pt reports  . Reglan [Metoclopramide]      dizziness  . Tylenol [Acetaminophen]     Went into shock     Review of Systems  CONSTITUTIONAL: No significant weight changes, fever, chills, weakness or fatigue.  HEENT:  - Eyes: No visual changes.  - Ears: No auditory changes. No pain.  - Nose: No sneezing, congestion, runny nose. - Throat: No sore throat. No changes in swallowing. SKIN: No rash or itching.  CARDIOVASCULAR: No chest pain, chest pressure or chest discomfort. No palpitations or edema.  RESPIRATORY: No shortness of breath, cough or sputum.  GASTROINTESTINAL: No anorexia, vomiting. No changes in bowel habits. No abdominal pain or blood.  GENITOURINARY: No dysuria. No frequency. No discharge.  NEUROLOGICAL: No headache, dizziness, syncope, paralysis, ataxia, numbness or tingling in the extremities. No memory changes. No change in  bowel or bladder control.  MUSCULOSKELETAL: Chronic back joint pain. No muscle pain. HEMATOLOGIC: No anemia, bleeding or bruising.  LYMPHATICS: No enlarged lymph nodes.  PSYCHIATRIC: No change in mood. No change in sleep pattern.  ENDOCRINOLOGIC: No reports of sweating, cold or heat intolerance. No polyuria or polydipsia.     Objective  BP 138/88 mmHg  Pulse 123  Temp(Src) 98.9 F (37.2 C) (Oral)  Resp 18  Wt 235 lb 4.8 oz (106.731 kg)  SpO2 98% Body mass index is 45.95 kg/(m^2).  Physical Exam  Constitutional: Patient is obese and well-nourished. In no distress.  HEENT:  - Head: Normocephalic and atraumatic.  - Ears: Bilateral TMs gray, no erythema or effusion - Nose: Nasal mucosa moist - Mouth/Throat: Oropharynx is clear and moist. No tonsillar hypertrophy or erythema. No post nasal drainage.  - Eyes: Conjunctivae clear, EOM movements normal. PERRLA. No scleral icterus.  Neck: Normal range of motion. Neck supple. No JVD present. No thyromegaly present.  Cardiovascular: Normal rate, regular rhythm and normal heart sounds.  No murmur heard.  Pulmonary/Chest: Effort normal and  breath sounds normal. No respiratory distress. Musculoskeletal: Normal range of motion bilateral UE. Peripheral vascular: Bilateral LE no edema. Neurological: CN II-XII grossly intact with no focal deficits. Alert and oriented to person, place, and time. Poor balance, walks with assistance of a cane. Skin: Skin is warm and dry. No rash noted. No erythema.  Psychiatric: Patient has a normal mood and affect. Behavior is normal in office today, facial tick noted (stuterring with fasciculation brief seconds then resolves). Judgment and thought content normal in office today.   Assessment & Plan  1. Uncontrolled type 2 diabetes mellitus without complication, with long-term current use of insulin ([place]) Patient's Hba1c goal is <6.5% for stringent control.  Patient's Hba1c goal is <7% is acceptable  Encouraged patient to continue efforts on checking blood glucose on a daily basis. Fasting blood glucose control goal is 80-130mg /dL and post prandial blood glucose control is 180mg /dL.  Reviewed diet, exercise, lifestyle changes and current medication regimen pertaining to diabetes with the patient.   Reminded patient of the required annual dilated retinal exam.   - CBC with Differential/Platelet - Comprehensive metabolic panel - Hemoglobin A1c - Lipid panel - TSH - Ambulatory referral to Ophthalmology  2. Chronic low back pain Referred to pain management.  - tiZANidine (ZANAFLEX) 4 MG tablet; Take 1 tablet (4 mg total) by mouth every 6 (six) hours as needed for muscle spasms.  Dispense: 360 tablet; Refill: 3 - Ambulatory referral to Pain Clinic  3. DDD (degenerative disc disease), lumbosacral Reviewed previous imaging.  - tiZANidine (ZANAFLEX) 4 MG tablet; Take 1 tablet (4 mg total) by mouth every 6 (six) hours as needed for muscle spasms.  Dispense: 360 tablet; Refill: 3 - Ambulatory referral to Pain Clinic  4. 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  5.  Nausea in adult patient I do not encourage that she uses phenergan and zofran together.   - ondansetron (ZOFRAN) 8 MG tablet; Take 1 tablet (8 mg total) by mouth every 8 (eight) hours as needed for nausea or vomiting.  Dispense: 270 tablet; Refill: 2 - CBC with Differential/Platelet - Comprehensive metabolic panel - Hemoglobin A1c - Lipid panel - TSH

## 2015-01-01 ENCOUNTER — Other Ambulatory Visit: Payer: Self-pay | Admitting: Family Medicine

## 2015-01-01 DIAGNOSIS — E1165 Type 2 diabetes mellitus with hyperglycemia: Principal | ICD-10-CM

## 2015-01-01 DIAGNOSIS — IMO0001 Reserved for inherently not codable concepts without codable children: Secondary | ICD-10-CM

## 2015-01-01 DIAGNOSIS — E875 Hyperkalemia: Secondary | ICD-10-CM

## 2015-01-01 DIAGNOSIS — D509 Iron deficiency anemia, unspecified: Secondary | ICD-10-CM

## 2015-01-01 DIAGNOSIS — E876 Hypokalemia: Secondary | ICD-10-CM | POA: Insufficient documentation

## 2015-01-01 LAB — CBC WITH DIFFERENTIAL/PLATELET
BASOS ABS: 0 10*3/uL (ref 0.0–0.2)
BASOS: 0 %
EOS (ABSOLUTE): 0.1 10*3/uL (ref 0.0–0.4)
EOS: 1 %
Hematocrit: 32.2 % — ABNORMAL LOW (ref 34.0–46.6)
Hemoglobin: 9.9 g/dL — ABNORMAL LOW (ref 11.1–15.9)
IMMATURE GRANS (ABS): 0 10*3/uL (ref 0.0–0.1)
Immature Granulocytes: 0 %
LYMPHS: 27 %
Lymphocytes Absolute: 2.6 10*3/uL (ref 0.7–3.1)
MCH: 23.9 pg — AB (ref 26.6–33.0)
MCHC: 30.7 g/dL — AB (ref 31.5–35.7)
MCV: 78 fL — AB (ref 79–97)
Monocytes Absolute: 0.7 10*3/uL (ref 0.1–0.9)
Monocytes: 7 %
NEUTROS PCT: 65 %
Neutrophils Absolute: 6.2 10*3/uL (ref 1.4–7.0)
PLATELETS: 537 10*3/uL — AB (ref 150–379)
RBC: 4.15 x10E6/uL (ref 3.77–5.28)
RDW: 15.3 % (ref 12.3–15.4)
WBC: 9.6 10*3/uL (ref 3.4–10.8)

## 2015-01-01 LAB — COMPREHENSIVE METABOLIC PANEL
A/G RATIO: 1.3 (ref 1.1–2.5)
ALBUMIN: 4.5 g/dL (ref 3.5–5.5)
ALK PHOS: 134 IU/L — AB (ref 39–117)
ALT: 21 IU/L (ref 0–32)
AST: 34 IU/L (ref 0–40)
BUN / CREAT RATIO: 11 (ref 9–23)
BUN: 12 mg/dL (ref 6–24)
CHLORIDE: 101 mmol/L (ref 97–108)
CO2: 24 mmol/L (ref 18–29)
Calcium: 9.5 mg/dL (ref 8.7–10.2)
Creatinine, Ser: 1.06 mg/dL — ABNORMAL HIGH (ref 0.57–1.00)
GFR calc non Af Amer: 63 mL/min/{1.73_m2} (ref >59)
GFR, EST AFRICAN AMERICAN: 72 mL/min/{1.73_m2} (ref >59)
GLUCOSE: 207 mg/dL — AB (ref 65–99)
Globulin, Total: 3.4 g/dL (ref 1.5–4.5)
POTASSIUM: 5.5 mmol/L — AB (ref 3.5–5.2)
Sodium: 141 mmol/L (ref 134–144)
TOTAL PROTEIN: 7.9 g/dL (ref 6.0–8.5)

## 2015-01-01 LAB — HEMOGLOBIN A1C
Est. average glucose Bld gHb Est-mCnc: 229 mg/dL
HEMOGLOBIN A1C: 9.6 % — AB (ref 4.8–5.6)

## 2015-01-01 LAB — LIPID PANEL
CHOL/HDL RATIO: 4.1 ratio (ref 0.0–4.4)
Cholesterol, Total: 268 mg/dL — ABNORMAL HIGH (ref 100–199)
HDL: 66 mg/dL (ref >39)
LDL CALC: 182 mg/dL — AB (ref 0–99)
Triglycerides: 101 mg/dL (ref 0–149)
VLDL Cholesterol Cal: 20 mg/dL (ref 5–40)

## 2015-01-01 LAB — TSH: TSH: 1.35 u[IU]/mL (ref 0.450–4.500)

## 2015-01-01 MED ORDER — LINAGLIPTIN 5 MG PO TABS: 5.0000 mg | ORAL_TABLET | Freq: Every day | ORAL | Status: DC

## 2015-01-01 MED ORDER — FERROUS SULFATE 325 (65 FE) MG PO TABS
325.0000 mg | ORAL_TABLET | Freq: Every day | ORAL | Status: DC
Start: 2015-01-01 — End: 2015-07-08

## 2015-01-02 ENCOUNTER — Other Ambulatory Visit: Payer: Self-pay

## 2015-01-02 ENCOUNTER — Telehealth: Payer: Self-pay

## 2015-01-02 MED ORDER — MELOXICAM 15 MG PO TABS: 15.0000 mg | ORAL_TABLET | Freq: Every day | ORAL | Status: DC

## 2015-01-02 NOTE — Telephone Encounter (Signed)
Chronic pain management and request for opioid prescriptions will have to be referred to pain management specialist. I can prescribe non controlled substances for pain such as: Celebrex, Ibuprofen, Naproxen, Meloxicam, Gabapentin.

## 2015-01-02 NOTE — Telephone Encounter (Signed)
[name] returned my call and her labs were reviewed after she verified her date of birth. [name] was informed that new prescriptions was sent into her pharmacy for her to start taking immediatly and to increase her water intake. [name] then asked about pain meds. I told her that I would consult with Dr. Nadine Counts and will give her a call back.

## 2015-01-02 NOTE — Telephone Encounter (Signed)
Patient was informed and decided to go with the Meloxicam.

## 2015-01-02 NOTE — Telephone Encounter (Signed)
Refill request was sent to Dr. Ashany Sundaram for approval and submission.  

## 2015-01-05 ENCOUNTER — Telehealth: Payer: Self-pay | Admitting: Family Medicine

## 2015-01-05 ENCOUNTER — Telehealth: Payer: Self-pay

## 2015-01-05 NOTE — Telephone Encounter (Signed)
Spoke to pt and she'll change the PCP to office.

## 2015-01-05 NOTE — Telephone Encounter (Signed)
Patient was informed via voicemail that both an Iron supplement and pain meds was sent in to [name] on [address].

## 2015-01-05 NOTE — Telephone Encounter (Signed)
Pt would like a call back to find out exactly the meds she is currently taking. She thinks she is suppose to be on Iron and a pain med but she doesn't think she is currently taking those.

## 2015-01-13 ENCOUNTER — Telehealth: Payer: Self-pay | Admitting: Family Medicine

## 2015-01-13 NOTE — Telephone Encounter (Signed)
Pt is requesting return call. States that the pain clinic is asking her to change the name of her clinic on both her insurance cards to them and if she doesn't then she will not be able to be seen at there clinic, (it's almost like they are trying to become her pcp). Please advise because she does not know what to do because she knows that Dr Nadine Counts also has to get paid.

## 2015-01-16 NOTE — Telephone Encounter (Signed)
I have not heard of other patient's needing to change insurance card information to continue care at pain management however if she wants proper pain evaluation and management she may need to comply with what they are requesting. I will still see her for her Diabetes and other needs within the scope of my practice. She is welcome to talk to [facility] regarding this as well.

## 2015-01-19 NOTE — Telephone Encounter (Signed)
Patient has scheduled appointment to be seen

## 2015-01-21 ENCOUNTER — Ambulatory Visit: Payer: Commercial Managed Care - HMO | Admitting: Family Medicine

## 2015-01-22 ENCOUNTER — Encounter: Payer: Self-pay | Admitting: Family Medicine

## 2015-01-22 ENCOUNTER — Ambulatory Visit: Payer: Commercial Managed Care - HMO | Admitting: Family Medicine

## 2015-01-22 VITALS — BP 136/84 | HR 115 | Temp 98.2°F | Resp 16 | Ht 61.0 in | Wt 237.6 lb

## 2015-01-23 NOTE — Progress Notes (Signed)
Not seen, patient came in for referral to pain clinic, but referral had been made already

## 2015-01-24 ENCOUNTER — Other Ambulatory Visit: Payer: Self-pay | Admitting: Nurse Practitioner

## 2015-01-26 ENCOUNTER — Telehealth: Payer: Self-pay | Admitting: Family Medicine

## 2015-01-26 ENCOUNTER — Ambulatory Visit: Payer: Medicare Other | Admitting: Pain Medicine

## 2015-01-26 NOTE — Telephone Encounter (Signed)
Patient is requesting return call [phone]

## 2015-01-26 NOTE — Telephone Encounter (Signed)
Called patient and she is requesting a office visit.

## 2015-01-26 NOTE — Telephone Encounter (Signed)
[name] made appointment for next week with Dr Nadine Counts

## 2015-01-27 NOTE — Telephone Encounter (Signed)
Patient is no longer our patient, please advise refill.  Thanks, [employer]

## 2015-01-28 ENCOUNTER — Other Ambulatory Visit: Payer: Self-pay | Admitting: Family Medicine

## 2015-01-28 MED ORDER — QUETIAPINE FUMARATE 400 MG PO TABS: 800.0000 mg | ORAL_TABLET | Freq: Every day | ORAL | Status: DC

## 2015-01-28 NOTE — Telephone Encounter (Signed)
PT SAID THAT SHE NEEDS HER NAUSEA MED REFILLED. HAS APT ON Monday BUT CAN NOT HOLD HER FOOD DOWN WITH OUT IT.

## 2015-01-28 NOTE — Telephone Encounter (Signed)
Sent to dr. sowles  

## 2015-01-29 ENCOUNTER — Telehealth: Payer: Self-pay

## 2015-01-29 DIAGNOSIS — R110 Nausea: Secondary | ICD-10-CM

## 2015-01-29 NOTE — Telephone Encounter (Signed)
Patient called wanting a refill of her Phenergan. I looked inher chart and seen where Dr. Nadine Counts sent in a refill this morning to [facility] on [address]. Patient stated that she will call her pharmacy.

## 2015-01-29 NOTE — Telephone Encounter (Signed)
Patient requesting refill. 

## 2015-02-02 ENCOUNTER — Ambulatory Visit (INDEPENDENT_AMBULATORY_CARE_PROVIDER_SITE_OTHER): Payer: Commercial Managed Care - HMO | Admitting: Family Medicine

## 2015-02-02 ENCOUNTER — Telehealth: Payer: Self-pay | Admitting: Family Medicine

## 2015-02-02 ENCOUNTER — Encounter: Payer: Self-pay | Admitting: Family Medicine

## 2015-02-02 ENCOUNTER — Other Ambulatory Visit: Payer: Self-pay | Admitting: Family Medicine

## 2015-02-02 VITALS — BP 132/86 | HR 122 | Temp 98.3°F | Resp 20 | Ht 61.0 in | Wt 230.6 lb

## 2015-02-02 DIAGNOSIS — E875 Hyperkalemia: Secondary | ICD-10-CM

## 2015-02-02 DIAGNOSIS — D509 Iron deficiency anemia, unspecified: Secondary | ICD-10-CM

## 2015-02-02 DIAGNOSIS — M545 Low back pain, unspecified: Secondary | ICD-10-CM

## 2015-02-02 DIAGNOSIS — D473 Essential (hemorrhagic) thrombocythemia: Secondary | ICD-10-CM | POA: Diagnosis not present

## 2015-02-02 DIAGNOSIS — Z9884 Bariatric surgery status: Secondary | ICD-10-CM

## 2015-02-02 DIAGNOSIS — D75839 Thrombocytosis, unspecified: Secondary | ICD-10-CM

## 2015-02-02 DIAGNOSIS — Z23 Encounter for immunization: Secondary | ICD-10-CM | POA: Diagnosis not present

## 2015-02-02 DIAGNOSIS — G8929 Other chronic pain: Secondary | ICD-10-CM | POA: Diagnosis not present

## 2015-02-02 DIAGNOSIS — N62 Hypertrophy of breast: Secondary | ICD-10-CM | POA: Diagnosis not present

## 2015-02-02 NOTE — Telephone Encounter (Signed)
Pt needs referrals to Pain Clinic. Pt states she has already done the psychiatrist part of that. Pt states she also needs a referral to the eye doctor. Pt was unsure where she was scheduled to go before and wants to keep the same place.

## 2015-02-02 NOTE — Progress Notes (Signed)
Name: [name]   MRN: [id number]    DOB: 04/10/1965   Date:02/02/2015       Progress Note  Subjective  Chief Complaint  Chief Complaint  Patient presents with  . Chest Pain    Patient states not heart its due to having larger breast since she has lost weight from gastric bypass  . Back Pain    radiating from chest (breast)    HPI  Large Breast: she has large breast 48 DDD, she used to weigh 300 lbs before bariatric surgery 05/02/2012, but her breasts has not decreased in size.  She has a long history of low back pain, started in 2001 after a work accident.  She states her back pain is gradually getting worse she also has pain / breast pulls under both arms, pain is 7/10, pain is described as aching and sharp, good rom, occasionally has radiculitis down both legs, but not right now Better when she takes breaks , worse when standing, walking or going up steps.  She had PT for her back years ago but not recently  Morbid obesity: s/p gastric bypass surgery and lost 70 lbs since her surgery, she follows protein rich diet, she uses a cane but has a exercise machine at home and also walks on her cul-de-sac a couple times a day.    Patient Active Problem List   Diagnosis Date Noted  . Morbid obesity ([place]) 02/02/2015  . Iron deficiency anemia, unspecified 01/01/2015  . Hyperkalemia 01/01/2015  . Insomnia 12/31/2014  . Nausea in adult patient 12/31/2014  . Non compliance with medical treatment 12/07/2014  . Chronic low back pain 10/04/2014  . DDD (degenerative disc disease), lumbosacral 09/08/2014  . Bipolar disorder ([place]) 09/08/2014  . Bipolar affective disorder ([place]) 04/08/2013  . History of bariatric surgery 05/02/2012  . Avitaminosis D 12/14/2011  . Polypharmacy 11/16/2011  . 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 Cyst of pancreas 11/12/2010  . Disorder of peripheral nervous system ([place]) 11/12/2010  . Apnea, sleep 11/12/2010  .  Uncontrolled type 2 diabetes mellitus without complication, without long-term current use of insulin ([place]) 11/12/2010    Past Surgical History  Procedure Laterality Date  . Gastric bypass    . Partial hysterectomy    . Knee surgery    . Cholecystectomy    . Abdominal hysterectomy  08/11/2010    Partial    Family History  Problem Relation Age of Onset  . Stroke Mother   . Diabetes Mother   . Stroke Father   . Hypertension Father     Social History   Social History  . Marital Status: Married    Spouse Name: N/A  . Number of Children: N/A  . Years of Education: N/A   Occupational History  . Not on file.   Social History Main Topics  . Smoking status: Never Smoker   . Smokeless tobacco: Never Used  . Alcohol Use: No  . Drug Use: No  . Sexual Activity:    Partners: Male    Birth Control/ Protection: Surgical   Other Topics Concern  . 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  B-D ULTRAFINE III SHORT PEN 31G X 8 MM MISC, USE TO ADMINISTER INSULIN FOUR TIMES DAILY AS DIRECTED, Disp: 100 each, Rfl: 5 .  ferrous sulfate 325 (65 FE) MG tablet, Take 1 tablet (325 mg total) by mouth daily with breakfast., Disp: 30 tablet, Rfl: 5 .  gabapentin (NEURONTIN) 800 MG tablet, , Disp: , Rfl:  .  insulin aspart (NOVOLOG) 100 UNIT/ML injection, Inject 10 Units into the skin 3 (three) times daily before meals. Sliding Scale, Disp: , Rfl:  .  insulin detemir (LEVEMIR) 100 UNIT/ML injection, Inject 60 Units into the skin daily., Disp: , Rfl:  .  lamoTRIgine (LAMICTAL) 100 MG tablet, , Disp: , Rfl:  .  linagliptin (TRADJENTA) 5 MG TABS tablet, Take 1 tablet (5 mg total) by mouth daily., Disp: 30 tablet, Rfl:  5 .  omeprazole (PRILOSEC) 20 MG capsule, Take 20 mg by mouth 2 (two) times daily., Disp: , Rfl:  .  ondansetron (ZOFRAN) 8 MG tablet, Take 1 tablet (8 mg total) by mouth every 8 (eight) hours as needed for nausea or vomiting., Disp: 270 tablet, Rfl: 2 .  promethazine (PHENERGAN) 25 MG tablet, TAKE 1 TABLET BY MOUTH EVERY 8 HOURS AS NEEDED FOR NAUSEA, Disp: 30 tablet, Rfl: 0 .  QUEtiapine (SEROQUEL) 400 MG tablet, TAKE 2 TABLETS(800 MG) BY MOUTH AT BEDTIME, Disp: 180 tablet, Rfl: 2 .  sertraline (ZOLOFT) 100 MG tablet, Take 100 mg by mouth daily., Disp: , Rfl:  .  tiZANidine (ZANAFLEX) 4 MG tablet, Take 1 tablet (4 mg total) by mouth every 6 (six) hours as needed for muscle spasms., Disp: 360 tablet, Rfl: 3  Allergies  Allergen Reactions  . Penicillins Anaphylaxis  . Lyrica [Pregabalin]     Shaking, couldn't talk. Possbile seizure per pt.  . Morphine And Related Itching    Just morphine pt reports  . Reglan [Metoclopramide] Nausea And Vomiting    Pt. States "does something to my stomach" dizziness  . Tylenol [Acetaminophen]     Went into shock     ROS  Constitutional: Negative for fever, positive  weight change.  Respiratory: Negative for cough and shortness of breath.   Cardiovascular: Negative for chest pain or palpitations.  Gastrointestinal: Negative for abdominal pain, no bowel changes.  Musculoskeletal: Negative for gait problem or joint swelling. Chronic back pain  Skin: Negative for rash.  Neurological: no  Dizziness over the past month  or headache.  No other specific complaints in a complete review of systems (except as listed in HPI above).  Objective  Filed Vitals:   02/02/15 0932  BP: 132/86  Pulse: 122  Temp: 98.3 F (36.8 C)  TempSrc: Oral  Resp: 20  Height: 5\' 1"  (1.549 m)  Weight: 230 lb 9.6 oz (104.599 kg)  SpO2: 96%    Body mass index is 43.59 kg/(m^2).  Physical Exam  Constitutional: Patient appears well-developed and well-nourished. Obese No  distress.  HEENT: head atraumatic, normocephalic, pupils equal and reactive to light,  neck supple, throat within normal limits Cardiovascular: Normal rate, regular rhythm and normal heart sounds.  No murmur heard. No BLE edema. Pulmonary/Chest: Effort normal and breath sounds normal. No respiratory distress. Breast: large pendulous breast, no masses, no nipple discharge, no rashes Abdominal: Soft.  There is no tenderness. Scar from bariatric surgery, some keloid formation  Psychiatric: Patient has a normal mood and affect. behavior is normal. Judgment and thought content normal. Muscular Skeletal: using a cane, slow gait,  mild tenderness during palpation of lumbar spine, negative straight leg raise  Recent Results (from the past 2160 hour(s))  CBC with Differential/Platelet     Status: Abnormal   Collection Time: 12/31/14 11:30 AM  Result Value Ref Range   WBC 9.6 3.4 - 10.8 x10E3/uL   RBC 4.15 3.77 - 5.28 x10E6/uL   Hemoglobin 9.9 (L) 11.1 - 15.9 g/dL   Hematocrit 32.2 (L) 34.0 - 46.6 %   MCV 78 (L) 79 - 97 fL   MCH 23.9 (L) 26.6 - 33.0 pg   MCHC 30.7 (L) 31.5 - 35.7 g/dL   RDW 15.3 12.3 - 15.4 %   Platelets 537 (H) 150 - 379 x10E3/uL   Neutrophils 65 %   Lymphs 27 %   Monocytes 7 %   Eos 1 %   Basos 0 %   Neutrophils Absolute 6.2 1.4 - 7.0 x10E3/uL   Lymphocytes Absolute 2.6 0.7 - 3.1 x10E3/uL   Monocytes Absolute 0.7 0.1 - 0.9 x10E3/uL   EOS (ABSOLUTE) 0.1 0.0 - 0.4 x10E3/uL   Basophils Absolute 0.0 0.0 - 0.2 x10E3/uL   Immature Granulocytes 0 %   Immature Grans (Abs) 0.0 0.0 - 0.1 x10E3/uL  Comprehensive metabolic panel     Status: Abnormal   Collection Time: 12/31/14 11:30 AM  Result Value Ref Range   Glucose 207 (H) 65 - 99 mg/dL   BUN 12 6 - 24 mg/dL   Creatinine, Ser 1.06 (H) 0.57 - 1.00 mg/dL   GFR calc non Af Amer 63 >59 mL/min/1.73   GFR calc Af Amer 72 >59 mL/min/1.73   BUN/Creatinine Ratio 11 9 - 23   Sodium 141 134 - 144 mmol/L    Comment: **Effective  January 05, 2015 the reference interval**   for Sodium, Serum will be changing to:                                             136 - 144    Potassium 5.5 (H) 3.5 - 5.2 mmol/L    Comment: **Effective January 05, 2015 the reference interval**   for Potassium, Serum will be changing to:                          0 -  7 days        3.7 - 5.2                          8 - 30 days        3.7 - 6.4                          1 -  6 months      3.8 - 6.0                   7 months -  1 year        3.8 - 5.3                              >1 year        3.5 - 5.2    Chloride 101 97 - 108 mmol/L    Comment: **Effective January 05, 2015 the reference interval**  for Chloride, Serum will be changing to:                                              97 - 106    CO2 24 18 - 29 mmol/L   Calcium 9.5 8.7 - 10.2 mg/dL   Total Protein 7.9 6.0 - 8.5 g/dL   Albumin 4.5 3.5 - 5.5 g/dL   Globulin, Total 3.4 1.5 - 4.5 g/dL   Albumin/Globulin Ratio 1.3 1.1 - 2.5   Bilirubin Total <0.2 0.0 - 1.2 mg/dL   Alkaline Phosphatase 134 (H) 39 - 117 IU/L   AST 34 0 - 40 IU/L   ALT 21 0 - 32 IU/L  Hemoglobin A1c     Status: Abnormal   Collection Time: 12/31/14 11:30 AM  Result Value Ref Range   Hgb A1c MFr Bld 9.6 (H) 4.8 - 5.6 %    Comment:          Pre-diabetes: 5.7 - 6.4          Diabetes: >6.4          Glycemic control for adults with diabetes: <7.0    Est. average glucose Bld gHb Est-mCnc 229 mg/dL  Lipid panel     Status: Abnormal   Collection Time: 12/31/14 11:30 AM  Result Value Ref Range   Cholesterol, Total 268 (H) 100 - 199 mg/dL   Triglycerides 101 0 - 149 mg/dL   HDL 66 >39 mg/dL    Comment: According to ATP-III Guidelines, HDL-C >59 mg/dL is considered a negative risk factor for CHD.    VLDL Cholesterol Cal 20 5 - 40 mg/dL   LDL Calculated 182 (H) 0 - 99 mg/dL   Chol/HDL Ratio 4.1 0.0 - 4.4 ratio units    Comment:                                   T. Chol/HDL Ratio                                              Men  Women                               1/2 Avg.Risk  3.4    3.3                                   Avg.Risk  5.0    4.4                                2X Avg.Risk  9.6    7.1                                3X Avg.Risk 23.4   11.0   TSH     Status: None   Collection Time: 12/31/14 11:30 AM  Result Value Ref Range   TSH 1.350 0.450 - 4.500 uIU/mL  PHQ2/9: Depression screen PHQ 2/9 12/31/2014  Decreased Interest 0  Down, Depressed, Hopeless 0  PHQ - 2 Score 0     Fall Risk: Fall Risk  12/31/2014  Falls in the past year? Yes  Number falls in past yr: 2 or more  Injury with Fall? Yes  Risk Factor Category  High Fall Risk  Risk for fall due to : History of fall(s);Impaired balance/gait;Impaired mobility     Assessment & Plan  1. Large breasts  - Ambulatory referral to Physical Therapy  2. Chronic low back pain  - Ambulatory referral to Physical Therapy  3. Morbid obesity, unspecified obesity type ([place])  Continue diet and exercise  4. History of bariatric surgery  Doing well  5. Iron deficiency anemia  She did not get ferrous sulfate, states not covered by insurance, she has pica - eating ice - advised to get otc medication and start right away, she states she had a colonoscopy less than one year ago. I could not find it on EHR today   7. Hyperkalemia  - Potassium  8. Needs flu shot  - Pneumococcal polysaccharide vaccine 23-valent greater than or equal to 2yo subcutaneous/IM  9. Need for pneumococcal vaccination  - Flu Vaccine QUAD 36+ mos IM

## 2015-02-02 NOTE — Telephone Encounter (Signed)
Both referrals placed already, see referrals 12/31/14

## 2015-02-02 NOTE — Telephone Encounter (Signed)
Pt notified about referrala.

## 2015-02-02 NOTE — Telephone Encounter (Signed)
[employer] SAID THAT THEY WOULD PAY FOR ALL OF THE BREAST REDUCTION AS LONG AS IT IS MEDICAL. THE DR ARE  Stevphen [place] @ [facility] PH# [phone] AND THE OTHER ONE Leda Gauze @ [facility] # IS [phone].

## 2015-02-04 NOTE — Telephone Encounter (Signed)
Will discuss during upcoming visit.

## 2015-02-10 ENCOUNTER — Encounter: Payer: Self-pay | Admitting: Family Medicine

## 2015-02-10 ENCOUNTER — Other Ambulatory Visit: Payer: Self-pay | Admitting: Nurse Practitioner

## 2015-02-10 ENCOUNTER — Ambulatory Visit (INDEPENDENT_AMBULATORY_CARE_PROVIDER_SITE_OTHER): Payer: Commercial Managed Care - HMO | Admitting: Family Medicine

## 2015-02-10 VITALS — BP 124/82 | HR 116 | Temp 98.1°F | Resp 16 | Wt 231.1 lb

## 2015-02-10 DIAGNOSIS — M5137 Other intervertebral disc degeneration, lumbosacral region: Secondary | ICD-10-CM

## 2015-02-10 DIAGNOSIS — N62 Hypertrophy of breast: Secondary | ICD-10-CM | POA: Insufficient documentation

## 2015-02-10 DIAGNOSIS — Z9884 Bariatric surgery status: Secondary | ICD-10-CM

## 2015-02-10 NOTE — Progress Notes (Signed)
Name: [name]   MRN: [id number]    DOB: 30-Jan-1966   Date:02/10/2015       Progress Note  Subjective  Chief Complaint  Chief Complaint  Patient presents with  . Breast Pain    patient stated that she is having a hard time breathing and that they are very painful.  . Advice Only    patient is requesting a handicap decal    HPI  [name] is a 49 y.o. female here today for follow up. Patient complains of arthralgias and back pain for which has been present for several years. Pain is located in lower back, is described as aching, and is intermittent, moderate . Associated symptoms include: decreased range of motion. The patient has tried heat, NSAIDs, position change, rest and gabapentin, tramadol for pain, with moderate relief. Related to injury: No. Requested referral to pain management specialist which has been placed.   She also reports chronic nausea on 2 different nausea medications. Long history of mental health issues. Has "seizure like" facial tics but does not report seeing a neurologist recently.  Large Breast: she has large breast 48 DDD, she used to weigh 300 lbs before bariatric surgery 05/02/2012, but her breasts has not decreased in size. She has a long history of low back pain, started in 2001 after a work accident. She states her back pain is gradually getting worse she also has pain / breast pulls under both arms, pain is 7/10, pain is described as aching and sharp, good rom, occasionally has radiculitis down both legs, but not right now Better when she takes breaks , worse when standing, walking or going up steps. She had PT for her back years ago but not recently, new referral is in place.  Morbid obesity: s/p gastric bypass surgery and lost 70 lbs since her surgery, she follows protein rich diet, she uses a cane but has a exercise machine at home and also walks on her cul-de-sac a couple times a day.   Says her sugars are reasonably controlled at home:  ranging 116-165. Did start Tradjenta 5 mg a day in addition to her insulin regimen.  Past Medical History  Diagnosis Date  . Diabetes mellitus without complication ([place])   . Hypercholesteremia   . Bipolar 1 disorder ([place])   . Arthritis   . Depression   . Blood transfusion without reported diagnosis   . Chicken pox   . History of stomach ulcers   . History of fainting spells of unknown cause   . Diabetes mellitus, type 2 ([place]) 11/12/2010  . 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 Alease Frame   . Disorder of peripheral nervous system ([place]) 11/12/2010  . Fibroid 11/12/2010    Overview:  Uterine fibroids with menorrhagia.  Fe def anemia.  GYN: Dr. Robina Ade.  Robotically-assisted total laparoscopic hysterectomy, aspiration of left ovarian cyst, cystoscopy. 08/11/09@[facility] .   . Back pain 09/08/2014  . Apnea, sleep 11/12/2010    Overview:  Overview:  A.  Sleep apnea.  Stated prev was diagnosed with sleep apnea in [place], [place] but moved to [place] and did not follow through with additional testing. ++++++++++++++++++++++++++++++++++++++ November 13, 2011 NPSG @ [facility]  overall AHI 2/hr; O2 nadir 83%. No supine sleep captured; side AHI: 1.9/hr; REM AHI 17.4/hr. ESS: 9/24; BMI 53.9 kg/m2; Wei    Patient Active Problem List   Diagnosis Date Noted  . Morbid obesity ([place]) 02/02/2015  .  Thrombocytosis ([place]) 02/02/2015  . Iron deficiency anemia, unspecified 01/01/2015  . Hyperkalemia 01/01/2015  . Insomnia 12/31/2014  . Nausea in adult patient 12/31/2014  . Non compliance with medical treatment 12/07/2014  . Chronic low back pain 10/04/2014  . DDD (degenerative disc disease), lumbosacral 09/08/2014  . Bipolar disorder ([place]) 09/08/2014  . Bipolar affective disorder ([place]) 04/08/2013  . History of bariatric surgery 05/02/2012  . Avitaminosis D 12/14/2011  . Polypharmacy 11/16/2011  . 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 Cyst of pancreas 11/12/2010  . Disorder of peripheral nervous system ([place]) 11/12/2010  . Apnea, sleep 11/12/2010  . Uncontrolled type 2 diabetes mellitus without complication, without long-term current use of insulin ([place]) 11/12/2010    Social History  Substance Use Topics  . Smoking status: Never Smoker   . Smokeless tobacco: Never Used  . Alcohol Use: No     Current outpatient prescriptions:  .  B-D ULTRAFINE III SHORT PEN 31G X 8 MM MISC, USE TO ADMINISTER INSULIN FOUR TIMES DAILY AS DIRECTED, Disp: 100 each, Rfl: 5 .  ferrous sulfate 325 (65 FE) MG tablet, Take 1 tablet (325 mg total) by mouth daily with breakfast., Disp: 30 tablet, Rfl: 5 .  gabapentin (NEURONTIN) 800 MG tablet, , Disp: , Rfl:  .  insulin aspart (NOVOLOG) 100 UNIT/ML injection, Inject 10 Units into the skin 3 (three) times daily before meals. Sliding Scale, Disp: , Rfl:  .  insulin detemir (LEVEMIR) 100 UNIT/ML injection, Inject 60 Units into the skin daily., Disp: , Rfl:  .  lamoTRIgine (LAMICTAL) 100 MG tablet, , Disp: , Rfl:  .  linagliptin (TRADJENTA) 5 MG TABS tablet, Take 1 tablet (5 mg total) by mouth daily., Disp: 30 tablet, Rfl: 5 .  omeprazole (PRILOSEC) 20 MG capsule, Take 20 mg by mouth 2 (two) times daily., Disp: , Rfl:  .  ondansetron (ZOFRAN) 8 MG tablet, Take 1 tablet (8 mg total) by mouth every 8 (eight) hours as needed for nausea or vomiting., Disp: 270 tablet, Rfl: 2 .  promethazine (PHENERGAN) 25 MG tablet, TAKE 1 TABLET BY MOUTH EVERY 8 HOURS AS NEEDED FOR NAUSEA, Disp: 30 tablet, Rfl: 0 .  QUEtiapine (SEROQUEL) 400 MG tablet, TAKE 2 TABLETS(800 MG) BY MOUTH AT BEDTIME, Disp: 180 tablet, Rfl: 2 .  sertraline (ZOLOFT) 100 MG tablet, Take 100 mg by mouth daily., Disp: , Rfl:  .  tiZANidine (ZANAFLEX) 4 MG tablet, Take 1 tablet (4 mg total) by mouth every 6 (six) hours as needed for muscle spasms., Disp: 360 tablet, Rfl: 3  Past Surgical History  Procedure  Laterality Date  . Gastric bypass    . Partial hysterectomy    . Knee surgery    . Cholecystectomy    . Abdominal hysterectomy  08/11/2010    Partial    Family History  Problem Relation Age of Onset  . Stroke Mother   . Diabetes Mother   . Stroke Father   . Hypertension Father     Allergies  Allergen Reactions  . Penicillins Anaphylaxis  . Lyrica [Pregabalin]     Shaking, couldn't talk. Possbile seizure per pt.  . Morphine And Related Itching    Just morphine pt reports  . Reglan [Metoclopramide] Nausea And Vomiting    Pt. States "does something to my stomach" dizziness  . Tylenol [Acetaminophen]     Went into shock     Review of Systems  CONSTITUTIONAL: No significant weight changes, fever, chills,  weakness or fatigue.  CARDIOVASCULAR: No chest pain, chest pressure or chest discomfort. No palpitations or edema.  RESPIRATORY: No shortness of breath, cough or sputum.  GASTROINTESTINAL: No anorexia, nausea, vomiting. No changes in bowel habits. No abdominal pain or blood.  NEUROLOGICAL: No headache, dizziness, syncope, paralysis, ataxia, numbness or tingling in the extremities. No memory changes. No change in bowel or bladder control.  MUSCULOSKELETAL: Chronic back joint pain. No muscle pain. HEMATOLOGIC: No anemia, bleeding or bruising.  LYMPHATICS: No enlarged lymph nodes.  PSYCHIATRIC: No change in mood. No change in sleep pattern.  ENDOCRINOLOGIC: No reports of sweating, cold or heat intolerance. No polyuria or polydipsia.     Objective  BP 124/82 mmHg  Pulse 116  Temp(Src) 98.1 F (36.7 C) (Oral)  Resp 16  Wt 231 lb 1.6 oz (104.826 kg)  SpO2 98% Body mass index is 43.69 kg/(m^2).  Physical Exam  Constitutional: Patient is obese and well-nourished. In no distress.  Cardiovascular: Normal rate, regular rhythm and normal heart sounds. No murmur heard.  Pulmonary/Chest: Effort normal and breath sounds normal. No respiratory distress. Breast: Large  pendulous breast, no masses, no nipple discharge, no rashes Musculoskeletal: Normal range of motion bilateral UE. Using a cane, slow gait, mild tenderness during palpation of lumbar spine, negative straight leg raise Peripheral vascular: Bilateral LE no edema. Neurological: CN II-XII grossly intact with no focal deficits. Alert and oriented to person, place, and time. Poor balance, walks with assistance of a cane. Skin: Skin is warm and dry. No rash noted. No erythema.  Psychiatric: Patient has a normal mood and affect. Behavior is normal in office today, facial tick noted (stuterring with fasciculation brief seconds then resolves). Judgment and thought content normal in office today.   Assessment & Plan  1. DDD (degenerative disc disease), lumbosacral Chronic long standing problem. Has pain management appointment secured for Dec 2016. Handicap placard form filled out.  - Ambulatory referral to Plastic Surgery  2. History of bariatric surgery Continue with bariatric diet.  - Ambulatory referral to Plastic Surgery  3. Large breasts Referral placed per her request to:  [facility] PH# [phone]   Leda Gauze @ [facility] # IS [phone].         - Ambulatory referral to Plastic Surgery

## 2015-02-17 ENCOUNTER — Encounter: Payer: Self-pay | Admitting: Pain Medicine

## 2015-02-17 ENCOUNTER — Ambulatory Visit: Payer: Commercial Managed Care - HMO | Attending: Pain Medicine | Admitting: Pain Medicine

## 2015-02-17 VITALS — BP 118/75 | HR 90 | Temp 98.7°F | Resp 18 | Wt 230.0 lb

## 2015-02-17 DIAGNOSIS — Z7189 Other specified counseling: Secondary | ICD-10-CM

## 2015-02-17 DIAGNOSIS — Z79891 Long term (current) use of opiate analgesic: Secondary | ICD-10-CM | POA: Diagnosis not present

## 2015-02-17 DIAGNOSIS — E7800 Pure hypercholesterolemia, unspecified: Secondary | ICD-10-CM | POA: Insufficient documentation

## 2015-02-17 DIAGNOSIS — M47816 Spondylosis without myelopathy or radiculopathy, lumbar region: Secondary | ICD-10-CM | POA: Diagnosis not present

## 2015-02-17 DIAGNOSIS — M545 Low back pain: Secondary | ICD-10-CM

## 2015-02-17 DIAGNOSIS — M792 Neuralgia and neuritis, unspecified: Secondary | ICD-10-CM | POA: Insufficient documentation

## 2015-02-17 DIAGNOSIS — E559 Vitamin D deficiency, unspecified: Secondary | ICD-10-CM | POA: Diagnosis not present

## 2015-02-17 DIAGNOSIS — M7918 Myalgia, other site: Secondary | ICD-10-CM

## 2015-02-17 DIAGNOSIS — M549 Dorsalgia, unspecified: Secondary | ICD-10-CM | POA: Diagnosis present

## 2015-02-17 DIAGNOSIS — M170 Bilateral primary osteoarthritis of knee: Secondary | ICD-10-CM

## 2015-02-17 DIAGNOSIS — M150 Primary generalized (osteo)arthritis: Secondary | ICD-10-CM

## 2015-02-17 DIAGNOSIS — F329 Major depressive disorder, single episode, unspecified: Secondary | ICD-10-CM | POA: Insufficient documentation

## 2015-02-17 DIAGNOSIS — M1990 Unspecified osteoarthritis, unspecified site: Secondary | ICD-10-CM | POA: Insufficient documentation

## 2015-02-17 DIAGNOSIS — G40909 Epilepsy, unspecified, not intractable, without status epilepticus: Secondary | ICD-10-CM | POA: Diagnosis not present

## 2015-02-17 DIAGNOSIS — Z79899 Other long term (current) drug therapy: Secondary | ICD-10-CM

## 2015-02-17 DIAGNOSIS — F1120 Opioid dependence, uncomplicated: Secondary | ICD-10-CM

## 2015-02-17 DIAGNOSIS — F1190 Opioid use, unspecified, uncomplicated: Secondary | ICD-10-CM | POA: Diagnosis not present

## 2015-02-17 DIAGNOSIS — Z5181 Encounter for therapeutic drug level monitoring: Secondary | ICD-10-CM

## 2015-02-17 DIAGNOSIS — F319 Bipolar disorder, unspecified: Secondary | ICD-10-CM | POA: Diagnosis not present

## 2015-02-17 DIAGNOSIS — M791 Myalgia: Secondary | ICD-10-CM

## 2015-02-17 DIAGNOSIS — M25562 Pain in left knee: Secondary | ICD-10-CM | POA: Diagnosis not present

## 2015-02-17 DIAGNOSIS — E1140 Type 2 diabetes mellitus with diabetic neuropathy, unspecified: Secondary | ICD-10-CM | POA: Diagnosis not present

## 2015-02-17 DIAGNOSIS — M179 Osteoarthritis of knee, unspecified: Secondary | ICD-10-CM

## 2015-02-17 DIAGNOSIS — G8929 Other chronic pain: Secondary | ICD-10-CM | POA: Diagnosis not present

## 2015-02-17 DIAGNOSIS — M1710 Unilateral primary osteoarthritis, unspecified knee: Secondary | ICD-10-CM | POA: Insufficient documentation

## 2015-02-17 DIAGNOSIS — M159 Polyosteoarthritis, unspecified: Secondary | ICD-10-CM

## 2015-02-17 DIAGNOSIS — E1142 Type 2 diabetes mellitus with diabetic polyneuropathy: Secondary | ICD-10-CM

## 2015-02-17 DIAGNOSIS — M79606 Pain in leg, unspecified: Secondary | ICD-10-CM

## 2015-02-17 MED ORDER — GABAPENTIN 800 MG PO TABS: 800.0000 mg | ORAL_TABLET | Freq: Four times a day (QID) | ORAL | Status: DC

## 2015-02-17 MED ORDER — TRAMADOL HCL 50 MG PO TABS: 100.0000 mg | ORAL_TABLET | Freq: Four times a day (QID) | ORAL | Status: DC | PRN

## 2015-02-17 NOTE — Progress Notes (Signed)
Safety precautions to be maintained throughout the outpatient stay will include: orient to surroundings, keep bed in low position, maintain call bell within reach at all times, provide assistance with transfer out of bed and ambulation.  

## 2015-02-17 NOTE — Progress Notes (Signed)
Patient's Name: [name] MRN: [id number] DOB: Jul 16, 1965 DOS: 02/17/2015  Primary Reason(s) for Visit: Encounter for evaluation before starting a Medication CC: Back Pain   HPI:   Ms. Conly is a 49 y.o. year old, female patient, who returns today as an established patient. 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[place]); Uncontrolled type 2 diabetes mellitus without complication, without long-term current use of insulin ([place]); Insomnia; Nausea in adult patient; Iron deficiency anemia, unspecified; Hyperkalemia; Morbid obesity ([place]); History of bariatric surgery (2014); Thrombocytosis ([place]); Large breasts; Chronic pain; Long term current use of opiate analgesic; Long term prescription opiate use; Opiate use; Opiate dependence ([place]); Encounter for therapeutic drug level monitoring; Encounter for chronic pain management; Diabetic peripheral neuropathy ([place]); Neuropathic pain; Neurogenic pain; Musculoskeletal pain; Chronic lower extremity pain (Referred Pain) (Bilateral) (R>L); Lumbar spondylosis; Osteoarthrosis; Lumbar facet syndrome (Bilateral) (R>L); Chronic knee pain (Left); Osteoarthritis of knees (Bilateral) (L>R); and Tricompartmental disease of knee (Left) on her problem list.. Her primarily concern today is the Back Pain     This patient was initially seen by me on 08/01/2012. After that she was again seen at this [place] facility on 10/20/2014. At that time the patient was sent out to have a medical psychology evaluation which she did. The results came back indicating that her substance use disorder risk is low/average. She comes into the clinic's today to be established for him pharmacological treatment.  The  patient indicates that her primary pain is the lower back followed by the lower extremity pain. In the case of the lower back the pain is worse on the right side when compared to the left. The same is true for the lower extremities. The pain goes down the lower extremities through the back of the legs but it never goes below that. The patient does not present with any weakness, or sensory anomalies compatible at this time with a radiculopathy. The pain seems to be referred from the lower back. Since she is morbidly obese this is likely to come from osteoarthritis of the lumbar spine and lumbar facet disease. Physical exam would be compatible with this since the patient does have pain on hyperextension and rotation. The patient did have a gastric bypass in 2014 after she was initially seen by me.  Further questioning reveals that the patient has been seen at other pain clinics which include the [facility] in [place] pain management in [place]. She indicates that she had some injections done at the [facility] but that they did not work. At the [facility] pain management practice, she told them that she had injections done and that they did not work and according to her, this was enough for them and they did not do any interventional treatments.  With regards to her medications, when we last saw her she was taking gabapentin 800 mg 1 tablet by mouth 4 times a day for her diabetic peripheral neuropathy. We will continue this medication as is. In addition, the patient had been on some tramadol 100 mg extended release tablets, one daily but she indicates that that only lasted for 2 hours. I told her that we would try the immediate release pills. At this point, the patient brought up that she has used the allotted in the past.  I'm a little concerned that she asked for this by name. For the time being, and until I can prove that this patient does have true pathology  that requires the use of these medications, I will not go any higher than that tramadol.  Today's Pain Score: 10-Worst pain ever, clinically the patient looks like a 1-2/10. Clear symptom exaggeration. Reported level of pain is incompatible with clinical observations. Pain Type: Chronic pain Pain Location: Back Pain Orientation: Lower Pain Descriptors / Indicators: Sharp, Aching, Shooting Pain Frequency: Constant  Date of Last Visit: 10/20/14 Service Provided on Last Visit: Evaluation  Pharmacotherapy Review: The patient's medication management is currently not under our care.  [place] PMP: I went ahead and reviewed the [place] PMP and I am a little concerned that she had told me that she had not taken any oxycodone in the past and it turns out that she did around January 2015. She has also tried hydrocodone as well as hydromorphone. The conversation that I had with her was on the impression that she had not really tried the tramadol immediate release 50 mg, but as it turns out she has been getting quite a bit of it. The last prescription was written on 12/18/2014. UDS Results:  Lab Results  Component Value Date   THCU NEGATIVE 04/21/2014   PCPSCRNUR NEGATIVE 04/21/2014   MDMA NEGATIVE 04/21/2014   AMPHETMU NEGATIVE 04/21/2014   METHADONE NEGATIVE 04/21/2014   ETOH < 3 04/21/2014    Substance Use Disorder (SUD) Risk Level: Low/average according to the medical psychology evaluation done by Dr. Jacqualine Code. Pharmacologic Plan: Today we will start her on tramadol 50 mg 1-2 tablets by mouth every 6 hours when necessary for pain and will go from there.  Last Available Lab Work: Office Visit on 12/31/2014  Component Date Value Ref Range Status  . WBC 12/31/2014 9.6  3.4 - 10.8 x10E3/uL Final  . RBC 12/31/2014 4.15  3.77 - 5.28 x10E6/uL Final  . Hemoglobin 12/31/2014 9.9* 11.1 - 15.9 g/dL Final  . Hematocrit 12/31/2014 32.2* 34.0 - 46.6 % Final  . MCV 12/31/2014 78* 79 - 97 fL Final   . MCH 12/31/2014 23.9* 26.6 - 33.0 pg Final  . MCHC 12/31/2014 30.7* 31.5 - 35.7 g/dL Final  . RDW 12/31/2014 15.3  12.3 - 15.4 % Final  . Platelets 12/31/2014 537* 150 - 379 x10E3/uL Final  . Neutrophils 12/31/2014 65   Final  . Lymphs 12/31/2014 27   Final  . Monocytes 12/31/2014 7   Final  . Eos 12/31/2014 1   Final  . Basos 12/31/2014 0   Final  . Neutrophils Absolute 12/31/2014 6.2  1.4 - 7.0 x10E3/uL Final  . Lymphocytes Absolute 12/31/2014 2.6  0.7 - 3.1 x10E3/uL Final  . Monocytes Absolute 12/31/2014 0.7  0.1 - 0.9 x10E3/uL Final  . EOS (ABSOLUTE) 12/31/2014 0.1  0.0 - 0.4 x10E3/uL Final  . Basophils Absolute 12/31/2014 0.0  0.0 - 0.2 x10E3/uL Final  . Immature Granulocytes 12/31/2014 0   Final  . Immature Grans (Abs) 12/31/2014 0.0  0.0 - 0.1 x10E3/uL Final  . Glucose 12/31/2014 207* 65 - 99 mg/dL Final  . BUN 12/31/2014 12  6 - 24 mg/dL Final  . Creatinine, Ser 12/31/2014 1.06* 0.57 - 1.00 mg/dL Final  . GFR calc non Af Amer 12/31/2014 63  >59 mL/min/1.73 Final  . GFR calc Af Amer 12/31/2014 72  >59 mL/min/1.73 Final  . BUN/Creatinine Ratio 12/31/2014 11  9 - 23 Final  .  Sodium 12/31/2014 141  134 - 144 mmol/L Final  . Potassium 12/31/2014 5.5* 3.5 - 5.2 mmol/L Final  . Chloride 12/31/2014 101  97 - 108 mmol/L Final  . CO2 12/31/2014 24  18 - 29 mmol/L Final  . Calcium 12/31/2014 9.5  8.7 - 10.2 mg/dL Final  . Total Protein 12/31/2014 7.9  6.0 - 8.5 g/dL Final  . Albumin 12/31/2014 4.5  3.5 - 5.5 g/dL Final  . Globulin, Total 12/31/2014 3.4  1.5 - 4.5 g/dL Final  . Albumin/Globulin Ratio 12/31/2014 1.3  1.1 - 2.5 Final  . Bilirubin Total 12/31/2014 <0.2  0.0 - 1.2 mg/dL Final  . Alkaline Phosphatase 12/31/2014 134* 39 - 117 IU/L Final  . AST 12/31/2014 34  0 - 40 IU/L Final  . ALT 12/31/2014 21  0 - 32 IU/L Final  . Hgb A1c MFr Bld 12/31/2014 9.6* 4.8 - 5.6 % Final  . Est. average glucose Bld gHb Est-m* 12/31/2014 229   Final  . Cholesterol, Total 12/31/2014 268* 100  - 199 mg/dL Final  . Triglycerides 12/31/2014 101  0 - 149 mg/dL Final  . HDL 12/31/2014 66  >39 mg/dL Final  . VLDL Cholesterol Cal 12/31/2014 20  5 - 40 mg/dL Final  . LDL Calculated 12/31/2014 182* 0 - 99 mg/dL Final  . Chol/HDL Ratio 12/31/2014 4.1  0.0 - 4.4 ratio units Final  . TSH 12/31/2014 1.350  0.450 - 4.500 uIU/mL Final    Allergies: Ms. Nordine is allergic to penicillins; lyrica; morphine and related; reglan; and tylenol.  Meds: The patient has a current medication list which includes the following prescription(s): b-d ultrafine iii short pen, ferrous sulfate, gabapentin, insulin aspart, insulin detemir, lamotrigine, linagliptin, omeprazole, ondansetron, promethazine, quetiapine, sertraline, tizanidine, and tramadol. Requested Prescriptions   Signed Prescriptions Disp Refills  . gabapentin (NEURONTIN) 800 MG tablet 120 tablet 0    Sig: Take 1 tablet (800 mg total) by mouth every 6 (six) hours.  . traMADol (ULTRAM) 50 MG tablet 240 tablet 0    Sig: Take 2 tablets (100 mg total) by mouth every 6 (six) hours as needed for severe pain.    ROS: Constitutional: Afebrile, no chills, well hydrated and well nourished Gastrointestinal: negative Musculoskeletal:negative Neurological: negative Behavioral/Psych: negative  PFSH: Medical:  Ms. 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 Family: family history includes Diabetes in her mother; Hypertension in her father; Stroke in her father and mother. Surgical:  has past  surgical history that includes Gastric bypass; Partial hysterectomy; Knee surgery; Cholecystectomy; and Abdominal hysterectomy (08/11/2010). Tobacco:  reports that she has never smoked. She has never used smokeless tobacco. Alcohol:  reports that she does not drink alcohol. Drug:  reports that she does not use illicit drugs.  Physical Exam: Vitals:  Today's Vitals   02/17/15 1356 02/17/15 1409  BP: 118/75   Pulse: 90   Temp: 98.7 F (37.1 C)   Resp: 18   Weight: 230 lb (104.327 kg)   SpO2: 100%   PainSc: 10-Worst pain ever 10-Worst pain ever  PainLoc: Back   Calculated BMI: Body mass index is 43.48 kg/(m^2). General appearance: alert, cooperative, appears stated age, no distress  and morbidly obese Eyes: conjunctivae/corneas clear. PERRL, EOM's intact. Fundi benign. Lungs: No evidence respiratory distress, no audible rales or ronchi and no use of accessory muscles of respiration Neck: no adenopathy, no carotid bruit, no JVD, supple, symmetrical, trachea midline and thyroid not enlarged, symmetric, no tenderness/mass/nodules Back: symmetric, no curvature. ROM normal. No CVA tenderness. Extremities: extremities normal, atraumatic, no cyanosis or edema Pulses: 2+ and symmetric Skin: Skin color, texture, turgor normal. No rashes or lesions Neurologic: Grossly normal    Assessment: Encounter Diagnosis:  Primary Diagnosis: Chronic pain [G89.29]  Plan: [place] was seen today for back pain.  Diagnoses and all orders for this visit:  Chronic pain -     gabapentin (NEURONTIN) 800 MG tablet; Take 1 tablet (800 mg total) by mouth every 6 (six) hours. -     Discontinue: traMADol (ULTRAM) 50 MG tablet; Take 2 tablets (100 mg total) by mouth every 6 (six) hours as needed for severe pain. (Patient not taking: Reported on 02/17/2015) -     COMPLETE METABOLIC PANEL WITH GFR; Future -     C-reactive protein; Future -     Magnesium; Future -     Sedimentation rate; Future -     Vitamin D2,D3  Panel; Future -     traMADol (ULTRAM) 50 MG tablet; Take 2 tablets (100 mg total) by mouth every 6 (six) hours as needed for severe pain.  Long term current use of opiate analgesic -     Drugs of abuse screen w/o alc, rtn urine-sln -     Drugs of abuse screen w/o alc, rtn urine-sln; Future  Long term prescription opiate use  Opiate use  Uncomplicated opioid dependence ([place])  Encounter for therapeutic drug level monitoring  Encounter for chronic pain management  Diabetic peripheral neuropathy (HCC)  Neuropathic pain  Neurogenic pain  Musculoskeletal pain  Chronic pain of lower extremity, unspecified laterality  Lumbar spondylosis, unspecified spinal osteoarthritis  Primary osteoarthritis involving multiple joints  Lumbar facet syndrome (Bilateral) (R>L)  Chronic knee pain (Left)  Primary osteoarthritis of both knees  Tricompartmental disease of knee (Left)     There are no Patient Instructions on file for this visit. Medications discontinued today:  Medications Discontinued During This Encounter  Medication Reason  . gabapentin (NEURONTIN) 800 MG tablet Reorder  . traMADol (ULTRAM) 50 MG tablet Reorder  . traMADol (ULTRAM) 50 MG tablet Reorder   Medications administered today:  Ms. Czekaj had no medications administered during this visit.  Primary Care Physician: Bobetta Lime, MD Location: [facility] Note by: Kathlen Brunswick. Dossie Arbour, M.D, DABA, DABAPM, DABPM, DABIPP, FIPP

## 2015-02-18 ENCOUNTER — Encounter: Payer: Self-pay | Admitting: Physical Therapy

## 2015-02-18 ENCOUNTER — Other Ambulatory Visit: Payer: Self-pay | Admitting: Family Medicine

## 2015-02-18 ENCOUNTER — Ambulatory Visit: Payer: Commercial Managed Care - HMO | Attending: Family Medicine | Admitting: Physical Therapy

## 2015-02-18 DIAGNOSIS — R262 Difficulty in walking, not elsewhere classified: Secondary | ICD-10-CM | POA: Insufficient documentation

## 2015-02-18 DIAGNOSIS — M5441 Lumbago with sciatica, right side: Secondary | ICD-10-CM | POA: Diagnosis not present

## 2015-02-18 DIAGNOSIS — M6281 Muscle weakness (generalized): Secondary | ICD-10-CM | POA: Insufficient documentation

## 2015-02-18 DIAGNOSIS — R29898 Other symptoms and signs involving the musculoskeletal system: Secondary | ICD-10-CM

## 2015-02-18 NOTE — Therapy (Signed)
[place] MAIN [facility] SERVICES [address] [place], [place], [address] Phone: [phone]   Fax:  [phone]  Physical Therapy Evaluation  Patient Details  Name: [name] MRN: [id number] Date of Birth: 10-12-1965 Referring Provider: Raliegh Ip. Sowles MD  Encounter Date: 02/18/2015      PT End of Session - 02/18/15 0907    Visit Number 1   Number of Visits 7   Date for PT Re-Evaluation 03/11/15   Authorization Type gcode 1   Authorization Time Period 10   PT Start Time 0830   PT Stop Time 0920   PT Time Calculation (min) 50 min   Activity Tolerance Patient limited by pain   Behavior During Therapy [facility] for tasks assessed/performed      Past Medical History  Diagnosis Date  . Diabetes mellitus without complication ([place])   . Hypercholesteremia   . Bipolar 1 disorder ([place])   . Arthritis   . Depression   . Blood transfusion without reported diagnosis   . Chicken pox   . History of stomach ulcers   . History of fainting spells of unknown cause   . Diabetes mellitus, type 2 ([place]) 11/12/2010  . 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 Alease Frame   . Disorder of peripheral nervous system ([place]) 11/12/2010  . Fibroid 11/12/2010    Overview:  Uterine fibroids with menorrhagia.  Fe def anemia.  GYN: Dr. Robina Ade.  Robotically-assisted total laparoscopic hysterectomy, aspiration of left ovarian cyst, cystoscopy. 08/11/09@[facility] .   . Back pain 09/08/2014  . Apnea, sleep 11/12/2010    Overview:  Overview:  A.  Sleep apnea.  Stated prev was diagnosed with sleep apnea in [place], [place] but moved to [place] and did not follow through with additional testing. ++++++++++++++++++++++++++++++++++++++ November 13, 2011 NPSG @ [facility]  overall AHI 2/hr; O2 nadir 83%. No supine sleep captured; side AHI: 1.9/hr; REM AHI 17.4/hr. ESS: 9/24; BMI 53.9 kg/m2; Wei  . Seizures [facility])     last seizure was  August 2016    Past Surgical History  Procedure Laterality Date  . Gastric bypass    . Partial hysterectomy    . Knee surgery    . Cholecystectomy    . Abdominal hysterectomy  08/11/2010    Partial    There were no vitals filed for this visit.  Visit Diagnosis:  Midline low back pain with right-sided sciatica - Plan: PT plan of care cert/re-cert  Weakness of back - Plan: PT plan of care cert/re-cert  Difficulty walking - Plan: PT plan of care cert/re-cert      Subjective Assessment - 02/18/15 0842    Subjective 49 yo Female reports increased back pain which has been hurting for a long time. She recently had weight loss surgery (gastric bypass) 05-02-12 and lost 100 pounds. She has large breasts and is still having back pain. Patient is hoping to get breast reduction to reduce back pain.    Pertinent History Has referral for breast reduction surgery;    How long can you sit comfortably? 30 min   How long can you stand comfortably? 30 min   How long can you walk comfortably? <200 feet   Diagnostic tests x-rays of back/knee- no abnormalities noted;    Patient Stated Goals relieve back pain;    Currently in Pain? Yes   Pain Score 10-Worst pain ever   Pain Location Back   Pain Orientation Mid;Lower  Pain Descriptors / Indicators Aching;Shooting   Pain Type Chronic pain   Pain Radiating Towards pain radiates down BLE, (R>L)   Pain Onset More than a month ago   Pain Frequency Constant   Aggravating Factors  standing, walking, sititng   Pain Relieving Factors medication   Effect of Pain on Daily Activities decreased functional activities due to pain.             [facility] PT Assessment - 02/18/15 0001    Assessment   Medical Diagnosis Back pain due to large breasts   Referring Provider K. Sowles MD   Onset Date/Surgical Date 03/22/99   Hand Dominance Right   Next MD Visit March 24, 2015   Prior Therapy Had PT 3 years ago with minimal relief   Precautions   Precautions  Fall   Restrictions   Weight Bearing Restrictions No   Balance Screen   Has the patient fallen in the past 6 months Yes   How many times? 2  reports she blanks out and falls   Has the patient had a decrease in activity level because of a fear of falling?  Yes   Is the patient reluctant to leave their home because of a fear of falling?  Yes   Home Environment   Additional Comments lives in single story home, no stairs; lives with husband;    Prior Function   Level of Independence Independent with basic ADLs;Requires assistive device for independence;Independent with household mobility with device   Vocation On disability   Leisure family time   Cognition   Overall Cognitive Status Within Functional Limits for tasks assessed   Behaviors Other (comment)  does have depression, bipolar disorder (under control with m   Observation/Other Assessments   Modified [place] 80% (80% is extreme disability)   Sensation   Light Touch Appears Intact   Posture/Postural Control   Posture Comments demonstrates mild slumped posture, able to self correct; good lumbar lordosis; decreased thoracic kyphosis    AROM   Overall AROM Comments BUE and BLE AROM is WFL; lumbar ROM is WFL, however increased pain in both lumbar extension and flexion    Strength   Overall Strength Comments BUE and BLE gross strength is WFL, back strength grossly 3/5; core abdominal strength 3/5;    Palpation   Palpation comment moderate tenderness to lower thoracic and lumbar paraspinals;    Slump test   Findings Negative   Side Right   Comment bilaterally;   Straight Leg Raise   Findings Positive   Side  Right  at 40 degrees;   Comment negative on left at 60 degrees   Transfers   Comments able to transfer sit<>Stand without HHA, slower to transfer, reports increased knee pain with transfers;    Ambulation/Gait   Gait Comments ambulates on even surface with SPC, slight antalgic gait pattern, slower gait speed, normal base  of support;    Standardized Balance Assessment   Five times sit to stand comments  21 sec without HHA (<60 yo, <10 sec indicates low fall risk)   10 Meter Walk 0.45 m/s with SPC (high fall risk, home ambulator)                           PT Education - 02/18/15 0907    Education provided Yes   Education Details plan of care   Person(s) Educated Patient   Methods Explanation   Comprehension Verbalized understanding  PT Long Term Goals - 03/11/2015 1057    PT LONG TERM GOAL #1   Title Patient will be independent in home exercise program to improve strength/mobility for better functional independence with ADLs by 03/11/15   Time 3   Period Weeks   Status New   PT LONG TERM GOAL #2   Title  Patient (< 53 years old) will complete five times sit to stand test in < 12 seconds indicating an increased LE strength and improved balance BY 03/11/15   Time 3   Period Weeks   Status New   PT LONG TERM GOAL #3   Title Patient will increase 10 meter walk test to >1.66m/s as to improve gait speed for better community ambulation and to reduce fall risk BY 03/11/15   Time 3   Period Weeks   Status New   PT LONG TERM GOAL #4   Title Patient will reduce modified Oswestry score to <20 as to demonstrate minimal disability with ADLs including improved sleeping tolerance, walking/sitting tolerance etc for better mobility with ADLs BY 03/11/15   Time 3   Period Weeks   Status New   PT LONG TERM GOAL #5   Title Patient will report a worst pain of 3/10 on VAS in    back        to improve tolerance with ADLs and reduced symptoms with activities by 03/11/15   Time 3   Period Weeks   Status New               Plan - 11-Mar-2015 1053    Clinical Impression Statement 49 yo Female presents to physical therapy with chronic back pain. Patient is s/p gastric bypass surgery in 2014 and has lost 100 pounds. She presents with large breasts and complains of chronic back pain in  lower thoracic and lumbar area. Patient is hoping to receive breast reduction surgery to reduce back pain. She is seeking conservative treatment prior to surgery to relieve symptoms. Patient exhibits impaired posture with decreased thoracic kyphosis and mild slumped position. She exhibits back and core abdominal weakness. Patient tested as a high fall risk with slower 5 times sit<>Stand and 10 meter walk test. She would benefit from skilled PT intervention to improve postural strength, reduce back pain and improve mobility.    Pt will benefit from skilled therapeutic intervention in order to improve on the following deficits Abnormal gait;Decreased endurance;Obesity;Decreased strength;Pain;Difficulty walking;Decreased mobility;Decreased balance;Improper body mechanics;Postural dysfunction;Decreased safety awareness   Rehab Potential Fair   Clinical Impairments Affecting Rehab Potential positive: motivation, negative: chronic pain, co-morbidities, obesity   PT Frequency 2x / week   PT Duration 3 weeks   PT Treatment/Interventions Patient/family education;Cryotherapy;Energy conservation;Functional mobility training;Therapeutic activities;Taping;Therapeutic exercise;Manual techniques;Moist Heat;Balance training;Neuromuscular re-education   PT Next Visit Plan initiate postural strengthening   PT Home Exercise Plan will initiate next visit   Consulted and Agree with Plan of Care Patient          G-Codes - 2015-03-11 0908    Functional Assessment Tool Used Modified oswestry, 10 meter walk, 5 times sit<>Stand   Functional Limitation Changing and maintaining body position   Changing and Maintaining Body Position Current Status [id number]) At least 40 percent but less than 60 percent impaired, limited or restricted   Changing and Maintaining Body Position Goal Status [id number]) At least 20 percent but less than 40 percent impaired, limited or restricted       Problem List Patient Active Problem List    Diagnosis  Date Noted  . Chronic pain 02/17/2015  . Long term current use of opiate analgesic 02/17/2015  . Long term prescription opiate use 02/17/2015  . Opiate use 02/17/2015  . Opiate dependence ([place]) 02/17/2015  . Encounter for therapeutic drug level monitoring 02/17/2015  . Encounter for chronic pain management 02/17/2015  . Diabetic peripheral neuropathy ([place]) 02/17/2015  . Neuropathic pain 02/17/2015  . Neurogenic pain 02/17/2015  . Musculoskeletal pain 02/17/2015  . Chronic lower extremity pain (Referred Pain) (Bilateral) (R>L) 02/17/2015  . Lumbar spondylosis 02/17/2015  . Osteoarthrosis 02/17/2015  . Lumbar facet syndrome (Bilateral) (R>L) 02/17/2015  . Chronic knee pain (Left) 02/17/2015  . 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 Tricompartmental disease of knee (Left) 02/17/2015  . Large breasts 02/10/2015  . Morbid obesity ([place]) 02/02/2015  . Thrombocytosis ([place]) 02/02/2015  . Iron deficiency anemia, unspecified 01/01/2015  . Hyperkalemia 01/01/2015  . Insomnia 12/31/2014  . Nausea in adult patient 12/31/2014  . Non compliance with medical treatment 12/07/2014  . Chronic low back pain (LBP>LEP) (Bilateral) (R>L) 10/04/2014  . Lumbar DDD 09/08/2014  . Bipolar disorder ([place]) 09/08/2014  . Bipolar affective disorder ([place]) 04/08/2013  . History of bariatric surgery (2014) 05/02/2012  . Avitaminosis D 12/14/2011  . Polypharmacy 11/16/2011  . 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 Cyst of pancreas 11/12/2010  . Disorder of peripheral nervous system ([place]) 11/12/2010  . Apnea, sleep 11/12/2010  . Uncontrolled type 2 diabetes mellitus without complication, without long-term current use of insulin ([place]) 11/12/2010    Hopkins,Aaisha Sliter PT, DPT 02/18/2015, 11:01 AM  [place] MAIN [facility] SERVICES [place], [place], [address] Phone: [phone]   Fax:   [phone]  Name: Gurtie Bialy MRN: [id number] Date of Birth: 18-Jun-1965

## 2015-02-24 ENCOUNTER — Ambulatory Visit: Payer: Commercial Managed Care - HMO | Attending: Family Medicine | Admitting: Physical Therapy

## 2015-02-26 ENCOUNTER — Other Ambulatory Visit: Payer: Self-pay | Admitting: Family Medicine

## 2015-02-26 NOTE — Telephone Encounter (Signed)
Patient requesting refill. 

## 2015-02-27 ENCOUNTER — Telehealth: Payer: Self-pay | Admitting: Family Medicine

## 2015-02-27 ENCOUNTER — Encounter: Payer: Commercial Managed Care - HMO | Admitting: Physical Therapy

## 2015-02-27 ENCOUNTER — Other Ambulatory Visit: Payer: Self-pay | Admitting: Family Medicine

## 2015-02-27 NOTE — Telephone Encounter (Signed)
This patient called stating that she took Metoprolol ER 25mg  last ngiht before she realized that it was sent in by Dr. Kae Heller ([place]) instead of Dr. Nadine Counts. She stated that she used to see that doctor but has not in a long time and did not want to stop the medication due to what the pamphlet was saying (increased chest pain, worse than a heart attack). I told her that I did not think it would be a problem since she only took one pill but that I would send Dr. Nadine Counts a message to see what she says.  She also asked if the referral has been placed for her breast reduction. I informed her that it was already done and has been authorized. I encouraged her to contact [facility] to see if anything else was needed.

## 2015-02-27 NOTE — Telephone Encounter (Signed)
Pt would like for you to call her back . Regarding one of her meds and her breast reduction.

## 2015-03-02 NOTE — Telephone Encounter (Signed)
Stop Metoprolol ER 25 mg use as this is a old medication not prescribed by me and not even on her current med list with my office. Will monitor her blood pressure at subsequent visits and re-initiate if needed.

## 2015-03-03 ENCOUNTER — Ambulatory Visit: Payer: Commercial Managed Care - HMO | Admitting: Physical Therapy

## 2015-03-06 ENCOUNTER — Ambulatory Visit: Payer: Commercial Managed Care - HMO | Admitting: Physical Therapy

## 2015-03-10 ENCOUNTER — Encounter: Payer: Commercial Managed Care - HMO | Admitting: Physical Therapy

## 2015-03-12 ENCOUNTER — Encounter: Payer: Commercial Managed Care - HMO | Admitting: Physical Therapy

## 2015-03-18 ENCOUNTER — Encounter: Payer: Commercial Managed Care - HMO | Admitting: Physical Therapy

## 2015-03-20 ENCOUNTER — Encounter: Payer: Commercial Managed Care - HMO | Admitting: Physical Therapy

## 2015-03-25 ENCOUNTER — Encounter: Payer: Commercial Managed Care - HMO | Admitting: Pain Medicine

## 2015-04-06 ENCOUNTER — Encounter: Payer: Commercial Managed Care - HMO | Admitting: Pain Medicine

## 2015-04-08 ENCOUNTER — Other Ambulatory Visit: Payer: Self-pay | Admitting: Pain Medicine

## 2015-04-09 ENCOUNTER — Telehealth: Payer: Self-pay

## 2015-04-09 NOTE — Telephone Encounter (Signed)
Lamictal is typically used for Bipolar mood disorder and alternative options need to be discussed with and managed by a Psychiatrist. May I place referral to [facility] or [facility]? It may take several months to get a first appointment.

## 2015-04-09 NOTE — Telephone Encounter (Signed)
Patient called states the Lamictal is not helping and wants to know if there is anything else she can try?

## 2015-04-12 ENCOUNTER — Emergency Department: Payer: Commercial Managed Care - HMO

## 2015-04-12 ENCOUNTER — Encounter: Payer: Self-pay | Admitting: Emergency Medicine

## 2015-04-12 ENCOUNTER — Emergency Department
Admission: EM | Admit: 2015-04-12 | Discharge: 2015-04-12 | Disposition: A | Discharge status: Home / Self Care | Payer: Commercial Managed Care - HMO | Attending: Emergency Medicine | Admitting: Emergency Medicine

## 2015-04-12 DIAGNOSIS — Z880 Allergy status to penicillin: Secondary | ICD-10-CM | POA: Insufficient documentation

## 2015-04-12 DIAGNOSIS — R252 Cramp and spasm: Secondary | ICD-10-CM | POA: Insufficient documentation

## 2015-04-12 DIAGNOSIS — R569 Unspecified convulsions: Secondary | ICD-10-CM | POA: Insufficient documentation

## 2015-04-12 DIAGNOSIS — Z794 Long term (current) use of insulin: Secondary | ICD-10-CM | POA: Diagnosis not present

## 2015-04-12 DIAGNOSIS — R253 Fasciculation: Secondary | ICD-10-CM | POA: Diagnosis not present

## 2015-04-12 DIAGNOSIS — E1142 Type 2 diabetes mellitus with diabetic polyneuropathy: Secondary | ICD-10-CM | POA: Diagnosis not present

## 2015-04-12 DIAGNOSIS — Z79899 Other long term (current) drug therapy: Secondary | ICD-10-CM | POA: Insufficient documentation

## 2015-04-12 LAB — CBC WITH DIFFERENTIAL/PLATELET
Band Neutrophils: 0 %
Basophils Absolute: 0 10*3/uL (ref 0–0.1)
Basophils Relative: 0 %
Blasts: 0 %
EOS PCT: 0 %
Eosinophils Absolute: 0 10*3/uL (ref 0–0.7)
HEMATOCRIT: 29.5 % — AB (ref 35.0–47.0)
Hemoglobin: 9 g/dL — ABNORMAL LOW (ref 12.0–16.0)
LYMPHS ABS: 2.6 10*3/uL (ref 1.0–3.6)
Lymphocytes Relative: 24 %
MCH: 21.7 pg — AB (ref 26.0–34.0)
MCHC: 30.6 g/dL — ABNORMAL LOW (ref 32.0–36.0)
MCV: 70.9 fL — ABNORMAL LOW (ref 80.0–100.0)
MONOS PCT: 4 %
Metamyelocytes Relative: 1 %
Monocytes Absolute: 0.4 10*3/uL (ref 0.2–0.9)
Myelocytes: 0 %
NEUTROS ABS: 7.7 10*3/uL — AB (ref 1.4–6.5)
NEUTROS PCT: 71 %
Other: 0 %
PLATELETS: 583 10*3/uL — AB (ref 150–440)
Promyelocytes Absolute: 0 %
RBC: 4.16 MIL/uL (ref 3.80–5.20)
RDW: 20.7 % — ABNORMAL HIGH (ref 11.5–14.5)
WBC: 10.7 10*3/uL (ref 3.6–11.0)
nRBC: 0 /100 WBC

## 2015-04-12 LAB — BASIC METABOLIC PANEL
Anion gap: 10 (ref 5–15)
BUN: 15 mg/dL (ref 6–20)
CALCIUM: 9.3 mg/dL (ref 8.9–10.3)
CO2: 26 mmol/L (ref 22–32)
CREATININE: 0.9 mg/dL (ref 0.44–1.00)
Chloride: 102 mmol/L (ref 101–111)
GFR calc Af Amer: >60 mL/min (ref >60)
Glucose, Bld: 130 mg/dL — ABNORMAL HIGH (ref 65–99)
Potassium: 3.3 mmol/L — ABNORMAL LOW (ref 3.5–5.1)
SODIUM: 138 mmol/L (ref 135–145)

## 2015-04-12 MED ORDER — LAMOTRIGINE 100 MG PO TABS
100.0000 mg | ORAL_TABLET | Freq: Once | ORAL | Status: AC
  Administered 2015-04-12: 100 mg via ORAL

## 2015-04-12 MED ORDER — LAMOTRIGINE 25 MG PO TABS
ORAL_TABLET | ORAL | Status: AC
  Administered 2015-04-12: 100 mg via ORAL
  Filled 2015-04-12: qty 4

## 2015-04-12 MED ORDER — LORAZEPAM 1 MG PO TABS
ORAL_TABLET | ORAL | Status: AC
  Administered 2015-04-12: 1 mg via ORAL
  Filled 2015-04-12: qty 1

## 2015-04-12 MED ORDER — LEVETIRACETAM 750 MG PO TABS: 750.0000 mg | ORAL_TABLET | Freq: Once | ORAL | Status: DC

## 2015-04-12 MED ORDER — LEVETIRACETAM 750 MG PO TABS
750.0000 mg | ORAL_TABLET | Freq: Once | ORAL | Status: AC
  Administered 2015-04-12: 750 mg via ORAL
  Filled 2015-04-12: qty 1

## 2015-04-12 MED ORDER — LORAZEPAM 1 MG PO TABS
1.0000 mg | ORAL_TABLET | Freq: Once | ORAL | Status: AC
  Administered 2015-04-12: 1 mg via ORAL

## 2015-04-12 MED ORDER — LORAZEPAM 1 MG PO TABS: 1.0000 mg | ORAL_TABLET | Freq: Four times a day (QID) | ORAL | Status: DC | PRN

## 2015-04-12 NOTE — ED Notes (Signed)
Patient transported to CT 

## 2015-04-12 NOTE — ED Provider Notes (Addendum)
[facility] Emergency Department Provider Note  ____________________________________________  Time seen: 1515  I have reviewed the triage vital signs and the nursing notes.  History by:  Patient and her husband  HISTORY  Chief Complaint Seizures     HPI [name] is a 50 y.o. female who has a history of seizures and had a tonic-clonic seizure this morning.  The patient reports that she has had seizures for one year. Records show that her seizures predate that, as she was evaluated for seizures at [facility] 46 in January 2015. This is the date of her last head CT.  The patient reports that she has trembling, spasms, and some stuttering, that is intermittent and they believe is directly related to her seizure activity as well. This stuttering and spasms has been increasing over the past 1-2 months. Her last full seizure was one month ago. This morning, her husband reports that she was doing okay but started having some of these symptoms and then he found her in the bedroom on the bed having a "full seizure"  The patient is on Lamictal. She reports it is not helping. She ran out of her medications and did not take any today, but she did have her dose yesterday. She has further evaluation pending at [facility]. The patient and her husband report they are speaking about changing her medications.  At this time, the patient overall looks well. She denies any headache. She does report she has ongoing twitching and spasm.   Past Medical History  Diagnosis Date  . Diabetes mellitus without complication ([place])   . Hypercholesteremia   . Bipolar 1 disorder ([place])   . Arthritis   . Depression   . Blood transfusion without reported diagnosis   . Chicken pox   . History of stomach ulcers   . History of fainting spells of unknown cause   . Diabetes mellitus, type 2 ([place]) 11/12/2010  . 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 Alease Frame   . Disorder of peripheral nervous system ([place]) 11/12/2010  . Fibroid 11/12/2010    Overview:  Uterine fibroids with menorrhagia.  Fe def anemia.  GYN: Dr. Robina Ade.  Robotically-assisted total laparoscopic hysterectomy, aspiration of left ovarian cyst, cystoscopy. 08/11/09@[facility] .   . Back pain 09/08/2014  . Apnea, sleep 11/12/2010    Overview:  Overview:  A.  Sleep apnea.  Stated prev was diagnosed with sleep apnea in [place], [place] but moved to [place] and did not follow through with additional testing. ++++++++++++++++++++++++++++++++++++++ November 13, 2011 NPSG @ [facility]  overall AHI 2/hr; O2 nadir 83%. No supine sleep captured; side AHI: 1.9/hr; REM AHI 17.4/hr. ESS: 9/24; BMI 53.9 kg/m2; [place]  . Seizures [facility])     last seizure was August 2016    Patient Active Problem List   Diagnosis Date Noted  . Chronic pain 02/17/2015  . Long term current use of opiate analgesic 02/17/2015  . Long term prescription opiate use 02/17/2015  . Opiate use 02/17/2015  . Opiate dependence ([place]) 02/17/2015  . Encounter for therapeutic drug level monitoring 02/17/2015  . Encounter for chronic pain management 02/17/2015  . Diabetic peripheral neuropathy ([place]) 02/17/2015  . Neuropathic pain 02/17/2015  . Neurogenic pain 02/17/2015  . Musculoskeletal pain 02/17/2015  . Chronic lower extremity pain (Referred Pain) (Bilateral) (R>L) 02/17/2015  . Lumbar spondylosis 02/17/2015  . Osteoarthrosis 02/17/2015  . Lumbar facet syndrome (Bilateral) (R>L) 02/17/2015  .  Chronic knee pain (Left) 02/17/2015  . 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 Tricompartmental disease of knee (Left) 02/17/2015  . Large breasts 02/10/2015  . Morbid obesity ([place]) 02/02/2015  . Thrombocytosis ([place]) 02/02/2015  . Iron deficiency anemia, unspecified 01/01/2015  . Hyperkalemia 01/01/2015  . Insomnia 12/31/2014  . Nausea in adult patient 12/31/2014  . Non compliance with  medical treatment 12/07/2014  . Chronic low back pain (LBP>LEP) (Bilateral) (R>L) 10/04/2014  . Lumbar DDD 09/08/2014  . Bipolar disorder ([place]) 09/08/2014  . Bipolar affective disorder ([place]) 04/08/2013  . History of bariatric surgery (2014) 05/02/2012  . Avitaminosis D 12/14/2011  . Polypharmacy 11/16/2011  . 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 Cyst of pancreas 11/12/2010  . Disorder of peripheral nervous system ([place]) 11/12/2010  . Apnea, sleep 11/12/2010  . Uncontrolled type 2 diabetes mellitus without complication, without long-term current use of insulin ([place]) 11/12/2010    Past Surgical History  Procedure Laterality Date  . Gastric bypass    . Partial hysterectomy    . Knee surgery    . Cholecystectomy    . Abdominal hysterectomy  08/11/2010    Partial    Current Outpatient Rx  Name  Route  Sig  Dispense  Refill  . B-D ULTRAFINE III SHORT PEN 31G X 8 MM MISC      USE TO ADMINISTER INSULIN FOUR TIMES DAILY AS DIRECTED   100 each   5   . ferrous sulfate 325 (65 FE) MG tablet   Oral   Take 1 tablet (325 mg total) by mouth daily with breakfast.   30 tablet   5   . gabapentin (NEURONTIN) 800 MG tablet   Oral   Take 1 tablet (800 mg total) by mouth every 6 (six) hours.   120 tablet   0   . insulin aspart (NOVOLOG) 100 UNIT/ML injection   Subcutaneous   Inject 10 Units into the skin 3 (three) times daily before meals. Sliding Scale         . insulin detemir (LEVEMIR) 100 UNIT/ML injection   Subcutaneous   Inject 60 Units into the skin daily.         Marland Kitchen lamoTRIgine (LAMICTAL) 100 MG tablet               . levETIRAcetam (KEPPRA) 750 MG tablet   Oral   Take 1 tablet (750 mg total) by mouth once.   30 tablet   1   . linagliptin (TRADJENTA) 5 MG TABS tablet   Oral   Take 1 tablet (5 mg total) by mouth daily.   30 tablet   5   . LORazepam (ATIVAN) 1 MG tablet   Oral   Take 1 tablet (1 mg total) by  mouth every 6 (six) hours as needed for seizure.   10 tablet   0   . NOVOLOG FLEXPEN 100 UNIT/ML FlexPen      INJECT 30 UNITS BEFORE EACH MEAL. PLUS SLIDING SCALE: 150-200 USE EXTRA 2 UNITS, 201-250 USE EXTRA 4 UNITS, 251-300 USE EXTRA 6 UNITS.   300 mL   3   . omeprazole (PRILOSEC) 20 MG capsule   Oral   Take 20 mg by mouth 2 (two) times daily.         . ondansetron (ZOFRAN) 8 MG tablet   Oral   Take 1 tablet (8 mg total) by mouth every 8 (eight) hours as needed for nausea or vomiting.  270 tablet   2   . promethazine (PHENERGAN) 25 MG tablet      TAKE 1 TABLET BY MOUTH EVERY 8 HOURS AS NEEDED FOR NAUSEA   60 tablet   1   . QUEtiapine (SEROQUEL) 400 MG tablet      TAKE 2 TABLETS(800 MG) BY MOUTH AT BEDTIME   180 tablet   2     **Patient requests 90 days supply**   . sertraline (ZOLOFT) 100 MG tablet   Oral   Take 200 mg by mouth daily.          Marland Kitchen tiZANidine (ZANAFLEX) 4 MG tablet   Oral   Take 1 tablet (4 mg total) by mouth every 6 (six) hours as needed for muscle spasms.   360 tablet   3     **Patient requests 90 days supply**   . traMADol (ULTRAM) 50 MG tablet   Oral   Take 2 tablets (100 mg total) by mouth every 6 (six) hours as needed for severe pain.   240 tablet   0     Do not place this medication, or any other prescri ...     Allergies Penicillins; Lyrica; Morphine and related; Reglan; and Tylenol  Family History  Problem Relation Age of Onset  . Stroke Mother   . Diabetes Mother   . Stroke Father   . Hypertension Father     Social History Social History  Substance Use Topics  . Smoking status: Never Smoker   . Smokeless tobacco: Never Used  . Alcohol Use: No    Review of Systems  Constitutional: Negative for fever/chills. ENT: Negative for congestion. Cardiovascular: Negative for chest pain. Respiratory: Negative for cough. Gastrointestinal: Negative for abdominal pain, vomiting and diarrhea. Genitourinary: Negative for  dysuria. Musculoskeletal: No back pain. Skin: Negative for rash. Neurological: Spasm, twitching, full seizure today. See history of present illness   10-point ROS otherwise negative.  ____________________________________________   PHYSICAL EXAM:  VITAL SIGNS: ED Triage Vitals  Enc Vitals Group     BP 04/12/15 1239 108/72 mmHg     Pulse Rate 04/12/15 1239 102     Resp 04/12/15 1239 18     Temp 04/12/15 1239 98.4 F (36.9 C)     Temp Source 04/12/15 1239 Oral     SpO2 04/12/15 1239 95 %     Weight 04/12/15 1239 220 lb (99.791 kg)     Height 04/12/15 1239 5' (1.524 m)     Head Cir --      Peak Flow --      Pain Score 04/12/15 1239 7     Pain Loc --      Pain Edu? --      Excl. in [place]? --     Constitutional:  Alert and oriented. Well appearing and in no distress. ENT   Head: Normocephalic and atraumatic.   Nose: No congestion/rhinnorhea.       Mouth: No erythema, no swelling. No bleeding, laceration, or acute abnormality   Cardiovascular: Normal rate, regular rhythm, no murmur noted Respiratory:  Normal respiratory effort, no tachypnea.    Breath sounds are clear and equal bilaterally.  Gastrointestinal: Soft, no distention. Nontender Back: No muscle spasm, no tenderness, no CVA tenderness. Musculoskeletal: No deformity noted. Nontender with normal range of motion in all extremities.  No noted edema. Neurologic:  Communicative. Normal appearing spontaneous movement in all 4 extremities. No gross focal neurologic deficits are appreciated.  Skin:  Skin is warm, dry. No  rash noted. Psychiatric: Mood and affect are normal. Speech and behavior are normal.  ____________________________________________    LABS (pertinent positives/negatives)  Labs Reviewed  CBC WITH DIFFERENTIAL/PLATELET - Abnormal; Notable for the following:    Hemoglobin 9.0 (*)    HCT 29.5 (*)    MCV 70.9 (*)    MCH 21.7 (*)    MCHC 30.6 (*)    RDW 20.7 (*)    Platelets 583 (*)    Neutro Abs  7.7 (*)    All other components within normal limits  BASIC METABOLIC PANEL - Abnormal; Notable for the following:    Potassium 3.3 (*)    Glucose, Bld 130 (*)    All other components within normal limits  URINALYSIS COMPLETEWITH MICROSCOPIC ([facility] ONLY)     ____________________________________________   RADIOLOGY  CT head: FINDINGS: The ventricles are normal in size and configuration. No extra-axial fluid collections are identified. The gray-white differentiation is normal. No CT findings for acute intracranial process such as hemorrhage or infarction. No mass lesions. The brainstem and cerebellum are grossly normal.  The bony structures are intact. The paranasal sinuses and mastoid air cells are clear. The globes are intact.  IMPRESSION: Normal head CT ____________________________________________   PROCEDURES    ____________________________________________   INITIAL IMPRESSION / ASSESSMENT AND PLAN / ED COURSE  Pertinent labs & imaging results that were available during my care of the patient were reviewed by me and considered in my medical decision making (see chart for details).  Overall well-appearing 50 year old female with history of seizures, with increased low-grade symptoms recently and a full seizure today.  Blood has been drawn. She does not have an IV established. I do not see indication for 1. We'll treat her with 1 mg of Ativan by mouth and we will add Keppra, 750 mg. I discussed this with the family and the patient. Given that they don't do limit was working, they welcome an additional agent.  With the increasing frequency of low-grade symptoms as well as the additional full seizure, and the fact that she has not had any additional imaging in 2 years, the patient and I have agreed to repeat a CT scan today.  ----------------------------------------- 4:48 PM on 04/12/2015 -----------------------------------------  CT negative. Labs overall  reasonable. Hemoglobin is 9.0. Most recent comparison that I saw was a hemoglobin of 9.9 a few months ago. Potassium is 3.3.  On reexamination the patient appears well. The twitching and spasming has significantly improved. We will keep her on Keppra and I will also write a prescription for Ativan to be used when necessary. Dispense #10  ____________________________________________   FINAL CLINICAL IMPRESSION(S) / ED DIAGNOSES  Final diagnoses:  Seizure ([place])   anemia    Ahmed Prima, MD 04/12/15 [phone]

## 2015-04-12 NOTE — Discharge Instructions (Signed)
The head CT looked okay. Your red blood cell count is low (hemoglobin 9.0). Follow-up with your regular physician about this and about her ongoing seizure treatment. Add Keppra to your medications now. You may take Ativan if needed for spasms, twitching, or seizure activity. Return to the emergency department if you have further prolonged seizure or other urgent concerns.  Seizure, Adult A seizure means there is unusual activity in the brain. A seizure can cause changes in attention or behavior. Seizures often cause shaking (convulsions). Seizures often last from 30 seconds to 2 minutes. HOME CARE   If you are given medicines, take them exactly as told by your doctor.  Keep all doctor visits as told.  Do not swim or drive until your doctor says it is okay.  Teach others what to do if you have a seizure. They should:  Lay you on the ground.  Put a cushion under your head.  Loosen any tight clothing around your neck.  Turn you on your side.  Stay with you until you get better. GET HELP RIGHT AWAY IF:   The seizure lasts longer than 2 to 5 minutes.  The seizure is very bad.  The person does not wake up after the seizure.  The person's attention or behavior changes. Drive the person to the emergency room or call your local emergency services (911 in [place] MAKE SURE YOU:   Understand these instructions.  Will watch your condition.  Will get help right away if you are not doing well or get worse.   This information is not intended to replace advice given to you by your health care provider. Make sure you discuss any questions you have with your health care provider.   Document Released: 08/24/2007 Document Revised: 05/30/2011 Document Reviewed: 10/17/2012 Elsevier Interactive Patient Education [employer].

## 2015-04-12 NOTE — ED Notes (Signed)
Per pt's husband pt began having "jerking motions this morning". Pt's husband states he sent her to bed heard a cry and went he went to check on her pt began having "full body jerks". Pt has hx of seizures and was seen by her PCP and prescribed what she believes to be lamictal. Pt's husband states this seizure last approx 6 mins with a short postictal time. Pt presents alert and oriented at this time.

## 2015-04-13 ENCOUNTER — Other Ambulatory Visit: Payer: Self-pay | Admitting: Family Medicine

## 2015-04-13 ENCOUNTER — Encounter: Payer: Self-pay | Admitting: Family Medicine

## 2015-04-13 ENCOUNTER — Ambulatory Visit (INDEPENDENT_AMBULATORY_CARE_PROVIDER_SITE_OTHER): Payer: Commercial Managed Care - HMO | Admitting: Family Medicine

## 2015-04-13 VITALS — BP 120/76 | HR 129 | Temp 98.6°F | Resp 16 | Ht 60.0 in | Wt 225.0 lb

## 2015-04-13 DIAGNOSIS — Z113 Encounter for screening for infections with a predominantly sexual mode of transmission: Secondary | ICD-10-CM | POA: Diagnosis not present

## 2015-04-13 DIAGNOSIS — E876 Hypokalemia: Secondary | ICD-10-CM | POA: Diagnosis not present

## 2015-04-13 DIAGNOSIS — E1165 Type 2 diabetes mellitus with hyperglycemia: Secondary | ICD-10-CM | POA: Diagnosis not present

## 2015-04-13 DIAGNOSIS — E1140 Type 2 diabetes mellitus with diabetic neuropathy, unspecified: Secondary | ICD-10-CM | POA: Diagnosis not present

## 2015-04-13 DIAGNOSIS — IMO0002 Reserved for concepts with insufficient information to code with codable children: Secondary | ICD-10-CM

## 2015-04-13 DIAGNOSIS — Z794 Long term (current) use of insulin: Secondary | ICD-10-CM

## 2015-04-13 DIAGNOSIS — G40109 Localization-related (focal) (partial) symptomatic epilepsy and epileptic syndromes with simple partial seizures, not intractable, without status epilepticus: Secondary | ICD-10-CM

## 2015-04-13 DIAGNOSIS — E1142 Type 2 diabetes mellitus with diabetic polyneuropathy: Secondary | ICD-10-CM

## 2015-04-13 DIAGNOSIS — G8929 Other chronic pain: Secondary | ICD-10-CM

## 2015-04-13 MED ORDER — LEVETIRACETAM 750 MG PO TABS: 750.0000 mg | ORAL_TABLET | Freq: Two times a day (BID) | ORAL | Status: DC

## 2015-04-13 MED ORDER — INSULIN ASPART 100 UNIT/ML FLEXPEN: PEN_INJECTOR | SUBCUTANEOUS | Status: DC

## 2015-04-13 NOTE — Progress Notes (Addendum)
Name: [name]   MRN: [id number]    DOB: May 01, 1965   Date:04/13/2015       Progress Note  Subjective  Chief Complaint  Chief Complaint  Patient presents with  . Medication Refill  . Diabetes    checks glucose 3x day hig-179, low-95 only taking levemir and novolog  . Depression  . Hyperlipidemia  . Insomnia  . Gastroesophageal Reflux  . Follow-up    went to ER yesterday for seizure and they are worsening    HPI  [name] is a 50 y.o. female here today for follow up.   Patient complains of arthralgias and back pain for which has been present for several years. Pain is located in lower back, is described as aching, and is intermittent, moderate . Associated symptoms include: decreased range of motion. The patient has tried heat, NSAIDs, position change, rest and gabapentin, tramadol for pain, with moderate relief. Related to injury: No. Requested referral to pain management specialist which she has established with.   She also reports chronic nausea on 2 different nausea medications. Long history of mental health issues. Has "seizure like" facial tics but does not report seeing a neurologist recently. Seen in the ER yesterday 04/12/15. Started on Keppra 750 mg one a day. Needs someone to manage this. Also prescribed her Ativan 1 mg but she has not picked this up.   Large Breast: she has large breast 48 DDD, she used to weigh 300 lbs before bariatric surgery 05/02/2012, but her breasts has not decreased in size. She has a long history of low back pain, started in 2001 after a work accident. She states her back pain is gradually getting worse she also has pain / breast pulls under both arms, pain is 7/10, pain is described as aching and sharp, good rom, occasionally has radiculitis down both legs, but not right now Better when she takes breaks , worse when standing, walking or going up steps. She had PT for her back years ago.  Morbid obesity: s/p gastric bypass surgery and  lost 70 lbs since her surgery, she follows protein rich diet, she uses a cane but has a exercise machine at home and also walks on her [place] a couple times a day.   Says her sugars are reasonably controlled at home: ranging 95-179. Tradjenta 5 mg a day was prescribed and started. Still taking Levemir 60 units a day and Novolog sliding scale with meals only if needed. If 200-250 give 10 units, if 251-300 give 15 units, 3001-350 give 20 units, 351-400 give 25 units.   Needs form filled out to justify transportation to [facility] to discuss breast reduction surgery.   Not taking iron tablet every day as the prescription was not covered by her insurance. Did not pick up OTC iron.  Past Medical History  Diagnosis Date  . Diabetes mellitus without complication ([place])   . Hypercholesteremia   . Bipolar 1 disorder ([place])   . Arthritis   . Depression   . Blood transfusion without reported diagnosis   . Chicken pox   . History of stomach ulcers   . History of fainting spells of unknown cause   . Diabetes mellitus, type 2 ([place]) 11/12/2010  . 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 Alease Buck   . Disorder of peripheral nervous system ([place]) 11/12/2010  . Fibroid 11/12/2010    Overview:  Uterine fibroids with menorrhagia.  Fe def anemia.  GYN: Dr. Robina Buck.  Robotically-assisted total laparoscopic hysterectomy, aspiration of left ovarian cyst, cystoscopy. 08/11/09_0 .   Marland Kitchen Back pain 09/08/2014  . Apnea, sleep 11/12/2010    Overview:  Overview:  A.  Sleep apnea.  Stated prev was diagnosed with sleep apnea in [place], [place] but moved to [place] and did not follow through with additional testing. ++++++++++++++++++++++++++++++++++++++ November 13, 2011 NPSG @ [facility]  overall AHI 2/hr; O2 nadir 83%. No supine sleep captured; side AHI: 1.9/hr; REM AHI 17.4/hr. ESS: 9/24; BMI 53.9 kg/m2; Wei  . Seizures [facility])     last seizure was August  2016    Patient Active Problem List   Diagnosis Date Noted  . Chronic pain 02/17/2015  . Long term current use of opiate analgesic 02/17/2015  . Long term prescription opiate use 02/17/2015  . Opiate use 02/17/2015  . Opiate dependence ([place]) 02/17/2015  . Encounter for therapeutic drug level monitoring 02/17/2015  . Encounter for chronic pain management 02/17/2015  . Diabetic peripheral neuropathy ([place]) 02/17/2015  . Neuropathic pain 02/17/2015  . Neurogenic pain 02/17/2015  . Musculoskeletal pain 02/17/2015  . Chronic lower extremity pain (Referred Pain) (Bilateral) (R>L) 02/17/2015  . Lumbar spondylosis 02/17/2015  . Osteoarthrosis 02/17/2015  . Lumbar facet syndrome (Bilateral) (R>L) 02/17/2015  . Chronic knee pain (Left) 02/17/2015  . 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 Tricompartmental disease of knee (Left) 02/17/2015  . Large breasts 02/10/2015  . Morbid obesity ([place]) 02/02/2015  . Thrombocytosis ([place]) 02/02/2015  . Iron deficiency anemia, unspecified 01/01/2015  . Hyperkalemia 01/01/2015  . Insomnia 12/31/2014  . Nausea in adult patient 12/31/2014  . Non compliance with medical treatment 12/07/2014  . Chronic low back pain (LBP>LEP) (Bilateral) (R>L) 10/04/2014  . Lumbar DDD 09/08/2014  . Bipolar disorder ([place]) 09/08/2014  . Bipolar affective disorder ([place]) 04/08/2013  . History of bariatric surgery (2014) 05/02/2012  . Avitaminosis D 12/14/2011  . Polypharmacy 11/16/2011  . 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 Cyst of pancreas 11/12/2010  . Disorder of peripheral nervous system ([place]) 11/12/2010  . Apnea, sleep 11/12/2010  . Uncontrolled type 2 diabetes mellitus without complication, without long-term current use of insulin ([place]) 11/12/2010    Social History  Substance Use Topics  . Smoking status: Never Smoker   . Smokeless tobacco: Never Used  . Alcohol Use: No     Current outpatient  prescriptions:  .  atorvastatin (LIPITOR) 80 MG tablet, Take 1 tablet by mouth daily., Disp: , Rfl: 7 .  B-D ULTRAFINE III SHORT PEN 31G X 8 MM MISC, USE TO ADMINISTER INSULIN FOUR TIMES DAILY AS DIRECTED, Disp: 100 each, Rfl: 5 .  ferrous sulfate 325 (65 FE) MG tablet, Take 1 tablet (325 mg total) by mouth daily with breakfast., Disp: 30 tablet, Rfl: 5 .  gabapentin (NEURONTIN) 800 MG tablet, Take 1 tablet (800 mg total) by mouth every 6 (six) hours., Disp: 120 tablet, Rfl: 0 .  hydrochlorothiazide (HYDRODIURIL) 25 MG tablet, Take 1 tablet by mouth daily., Disp: , Rfl: 1 .  insulin aspart (NOVOLOG) 100 UNIT/ML injection, Inject 10 Units into the skin 3 (three) times daily before meals. Sliding Scale, Disp: , Rfl:  .  insulin detemir (LEVEMIR) 100 UNIT/ML injection, Inject 60 Units into the skin daily., Disp: , Rfl:  .  lamoTRIgine (LAMICTAL) 100 MG tablet, , Disp: , Rfl:  .  NOVOLOG FLEXPEN 100 UNIT/ML FlexPen, INJECT 30 UNITS BEFORE EACH MEAL. PLUS SLIDING SCALE: 150-200  USE EXTRA 2 UNITS, 201-250 USE EXTRA 4 UNITS, 251-300 USE EXTRA 6 UNITS., Disp: 300 mL, Rfl: 3 .  omeprazole (PRILOSEC) 20 MG capsule, Take 20 mg by mouth 2 (two) times daily., Disp: , Rfl:  .  ondansetron (ZOFRAN) 8 MG tablet, Take 1 tablet (8 mg total) by mouth every 8 (eight) hours as needed for nausea or vomiting., Disp: 270 tablet, Rfl: 2 .  promethazine (PHENERGAN) 25 MG tablet, TAKE 1 TABLET BY MOUTH EVERY 8 HOURS AS NEEDED FOR NAUSEA, Disp: 60 tablet, Rfl: 1 .  QUEtiapine (SEROQUEL) 400 MG tablet, TAKE 2 TABLETS(800 MG) BY MOUTH AT BEDTIME, Disp: 180 tablet, Rfl: 2 .  sertraline (ZOLOFT) 100 MG tablet, Take 200 mg by mouth daily. , Disp: , Rfl:  .  tiZANidine (ZANAFLEX) 4 MG tablet, Take 1 tablet (4 mg total) by mouth every 6 (six) hours as needed for muscle spasms., Disp: 360 tablet, Rfl: 3 .  traMADol (ULTRAM) 50 MG tablet, Take 2 tablets (100 mg total) by mouth every 6 (six) hours as needed for severe pain., Disp: 240  tablet, Rfl: 0  Past Surgical History  Procedure Laterality Date  . Gastric bypass    . Partial hysterectomy    . Knee surgery    . Cholecystectomy    . Abdominal hysterectomy  08/11/2010    Partial    Family History  Problem Relation Age of Onset  . Stroke Mother   . Diabetes Mother   . Stroke Father   . Hypertension Father     Allergies  Allergen Reactions  . Penicillins Anaphylaxis  . Lyrica [Pregabalin]     Shaking, couldn't talk. Possbile seizure per pt.  . Morphine And Related Itching    Just morphine pt reports  . Reglan [Metoclopramide] Nausea And Vomiting    Pt. States "does something to my stomach" Pt. States "does something to my stomach" dizziness  . Tylenol [Acetaminophen]     Went into shock     Review of Systems  CONSTITUTIONAL: No significant weight changes, fever, chills, weakness or fatigue.  CARDIOVASCULAR: No chest pain, chest pressure or chest discomfort. No palpitations or edema.  RESPIRATORY: No shortness of breath, cough or sputum.  GASTROINTESTINAL: No anorexia, nausea, vomiting. No changes in bowel habits. No abdominal pain or blood.  NEUROLOGICAL: No headache, dizziness, syncope, paralysis, ataxia. Yes numbness or tingling in the extremities. No memory changes. No change in bowel or bladder control.  MUSCULOSKELETAL: Chronic back joint pain. No muscle pain. HEMATOLOGIC: No anemia, bleeding or bruising.  LYMPHATICS: No enlarged lymph nodes.  PSYCHIATRIC: No change in mood. No change in sleep pattern.  ENDOCRINOLOGIC: No reports of sweating, cold or heat intolerance. No polyuria or polydipsia.    Objective  BP 120/76 mmHg  Pulse 129  Temp(Src) 98.6 F (37 C) (Oral)  Resp 16  Ht 5' (1.524 m)  Wt 225 lb (102.059 kg)  BMI 43.94 kg/m2  SpO2 98% Body mass index is 43.94 kg/(m^2).  Physical Exam  Constitutional: Patient is obese and well-nourished. In no distress.  Cardiovascular: Normal rate, regular rhythm and normal heart  sounds. No murmur heard.  Pulmonary/Chest: Effort normal and breath sounds normal. No respiratory distress. Breast: Large pendulous breast, no masses, no nipple discharge, no rashes Musculoskeletal: Normal range of motion bilateral UE. Using a cane, slow gait, mild tenderness during palpation of lumbar spine, negative straight leg raise Peripheral vascular: Bilateral LE no edema. Neurological: CN II-XII grossly intact with no focal deficits. Alert and oriented  to person, place, and time. Poor balance, walks with assistance of a cane. Skin: Skin is warm and dry. No rash noted. No erythema.  Psychiatric: Patient has a normal mood and affect. Behavior is normal in office today, facial tick noted (stuterring with fasciculation brief seconds then resolves). Judgment and thought content normal in office today.  Recent Results (from the past 2160 hour(s))  CBC with Differential     Status: Abnormal   Collection Time: 04/12/15  3:14 PM  Result Value Ref Range   WBC 10.7 3.6 - 11.0 K/uL   RBC 4.16 3.80 - 5.20 MIL/uL   Hemoglobin 9.0 (L) 12.0 - 16.0 g/dL   HCT 29.5 (L) 35.0 - 47.0 %   MCV 70.9 (L) 80.0 - 100.0 fL   MCH 21.7 (L) 26.0 - 34.0 pg   MCHC 30.6 (L) 32.0 - 36.0 g/dL   RDW 20.7 (H) 11.5 - 14.5 %   Platelets 583 (H) 150 - 440 K/uL   Neutrophils Relative % 71 %   Lymphocytes Relative 24 %   Monocytes Relative 4 %   Eosinophils Relative 0 %   Basophils Relative 0 %   Band Neutrophils 0 %   Metamyelocytes Relative 1 %   Myelocytes 0 %   Promyelocytes Absolute 0 %   Blasts 0 %   nRBC 0 0 /100 WBC   Other 0 %   Neutro Abs 7.7 (H) 1.4 - 6.5 K/uL   Lymphs Abs 2.6 1.0 - 3.6 K/uL   Monocytes Absolute 0.4 0.2 - 0.9 K/uL   Eosinophils Absolute 0.0 0 - 0.7 K/uL   Basophils Absolute 0.0 0 - 0.1 K/uL   Smear Review MILD LEFT SHIFT (1-5% METAS, OCC MYELO, OCC BANDS)     Comment: VACUOLATED NEUTROPHILS  Basic metabolic panel     Status: Abnormal   Collection Time: 04/12/15  3:14 PM  Result  Value Ref Range   Sodium 138 135 - 145 mmol/L   Potassium 3.3 (L) 3.5 - 5.1 mmol/L   Chloride 102 101 - 111 mmol/L   CO2 26 22 - 32 mmol/L   Glucose, Bld 130 (H) 65 - 99 mg/dL   BUN 15 6 - 20 mg/dL   Creatinine, Ser 0.90 0.44 - 1.00 mg/dL   Calcium 9.3 8.9 - 10.3 mg/dL   GFR calc non Af Amer >60 >60 mL/min   GFR calc Af Amer >60 >60 mL/min    Comment: (NOTE) The eGFR has been calculated using the CKD EPI equation. This calculation has not been validated in all clinical situations. eGFR's persistently <60 mL/min signify possible Chronic Kidney Disease.    Anion gap 10 5 - 15    Diabetic Foot Exam - Simple   Simple Foot Form  Diabetic Foot exam was performed with the following findings:  Yes 04/13/2015  8:43 AM  Visual Inspection  No deformities, no ulcerations, no other skin breakdown bilaterally:  Yes  Sensation Testing  Intact to touch and monofilament testing bilaterally:  Yes  Pulse Check  Posterior Tibialis and Dorsalis pulse intact bilaterally:  Yes  Comments      Assessment & Plan  1. Uncontrolled type 2 diabetes mellitus with diabetic neuropathy, with long-term current use of insulin (HCC) Will check HBA1C at lab today. Goal Hba1c is less than 7%. She could not provide urine for microalbumin testing today.  - insulin aspart (NOVOLOG FLEXPEN) 100 UNIT/ML FlexPen; Inject [place] if 200-250 give 10 units, if 251-300 give 15 units, 3001-350 give 20  units, 351-400 give 25 units.  Dispense: 15 mL; Refill: 1 - Hemoglobin A1c  2. Diabetic peripheral neuropathy (HCC) Stable.  3. 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   - levETIRAcetam (KEPPRA) 750 MG tablet; Take 1 tablet (750 mg total) by mouth 2 (two) times daily.  Dispense: 30 tablet; Refill: 2  4. Chronic pain Continue f/u with pain management.  5. Hypokalemia Recheck.  - Basic Metabolic Panel (BMET)  6. Screening for STD (sexually transmitted disease)  - HIV antibody

## 2015-04-13 NOTE — Addendum Note (Signed)
Addended by: Bobetta Lime on: 04/13/2015 08:49 AM   Modules accepted: Miquel Dunn

## 2015-04-14 LAB — HIV ANTIBODY (ROUTINE TESTING W REFLEX): HIV Screen 4th Generation wRfx: NONREACTIVE

## 2015-04-14 LAB — BASIC METABOLIC PANEL
BUN / CREAT RATIO: 12 (ref 9–23)
BUN: 12 mg/dL (ref 6–24)
CO2: 26 mmol/L (ref 18–29)
CREATININE: 1.02 mg/dL — AB (ref 0.57–1.00)
Calcium: 9.5 mg/dL (ref 8.7–10.2)
Chloride: 97 mmol/L (ref 96–106)
GFR, EST AFRICAN AMERICAN: 76 mL/min/{1.73_m2} (ref >59)
GFR, EST NON AFRICAN AMERICAN: 66 mL/min/{1.73_m2} (ref >59)
Glucose: 152 mg/dL — ABNORMAL HIGH (ref 65–99)
POTASSIUM: 3.8 mmol/L (ref 3.5–5.2)
SODIUM: 139 mmol/L (ref 134–144)

## 2015-04-14 LAB — HEMOGLOBIN A1C
ESTIMATED AVERAGE GLUCOSE: 146 mg/dL
HEMOGLOBIN A1C: 6.7 % — AB (ref 4.8–5.6)

## 2015-04-21 ENCOUNTER — Other Ambulatory Visit: Payer: Self-pay | Admitting: Family Medicine

## 2015-04-21 ENCOUNTER — Other Ambulatory Visit: Payer: Self-pay

## 2015-04-21 DIAGNOSIS — R110 Nausea: Secondary | ICD-10-CM

## 2015-04-21 NOTE — Telephone Encounter (Signed)
Patient called states that the [name] is not working.  Still having a lot of shakes? wabnts to see if you can give something else?

## 2015-04-22 ENCOUNTER — Other Ambulatory Visit: Payer: Self-pay | Admitting: Family Medicine

## 2015-04-22 DIAGNOSIS — G40109 Localization-related (focal) (partial) symptomatic epilepsy and epileptic syndromes with simple partial seizures, not intractable, without status epilepticus: Secondary | ICD-10-CM

## 2015-04-22 NOTE — Telephone Encounter (Signed)
I am not comfortable initiating or adjusting seizure medications as it is out of the scope of my practice. I refilled the keppra for her previously because another provider initiated it. If she needs adjustments or new medications for seizures she has to see a Neurology specialist. Referral placed.

## 2015-05-04 ENCOUNTER — Ambulatory Visit: Payer: Commercial Managed Care - HMO | Admitting: Family Medicine

## 2015-05-04 ENCOUNTER — Ambulatory Visit: Payer: Commercial Managed Care - HMO | Admitting: Pain Medicine

## 2015-05-06 ENCOUNTER — Ambulatory Visit: Payer: Commercial Managed Care - HMO | Admitting: Neurology

## 2015-05-06 ENCOUNTER — Telehealth: Payer: Self-pay

## 2015-05-06 DIAGNOSIS — G40109 Localization-related (focal) (partial) symptomatic epilepsy and epileptic syndromes with simple partial seizures, not intractable, without status epilepticus: Secondary | ICD-10-CM

## 2015-05-06 NOTE — Telephone Encounter (Signed)
Due to insurance transportation restrictions she must consult with readily available specialists within her covered county first.

## 2015-05-06 NOTE — Telephone Encounter (Signed)
So the patient was requesting to go to [place].  But she states it is ok to put new referral in for [facility] due to [facility] transportation issues.

## 2015-05-08 ENCOUNTER — Encounter: Payer: Self-pay | Admitting: Neurology

## 2015-05-16 ENCOUNTER — Other Ambulatory Visit: Payer: Self-pay | Admitting: Pain Medicine

## 2015-05-20 ENCOUNTER — Telehealth: Payer: Self-pay | Admitting: Pain Medicine

## 2015-05-20 NOTE — Telephone Encounter (Signed)
Patient would like to sched an appt, has missed 3 prior appts, can we sched one more visit ?

## 2015-05-20 NOTE — Telephone Encounter (Signed)
error 

## 2015-05-20 NOTE — Telephone Encounter (Signed)
Patient called wanting to sched a med / eval appt, she has missed 3 appts, can we put her on for one more ?

## 2015-05-22 ENCOUNTER — Other Ambulatory Visit: Payer: Self-pay

## 2015-05-22 DIAGNOSIS — G8929 Other chronic pain: Secondary | ICD-10-CM

## 2015-05-22 NOTE — Telephone Encounter (Signed)
Patient states she needs new referral to [facility] pain clinic.  Her current one no longer excepts her ins.  Also needs to get refill on pain med?

## 2015-05-25 ENCOUNTER — Other Ambulatory Visit: Payer: Self-pay | Admitting: Family Medicine

## 2015-05-27 ENCOUNTER — Encounter: Payer: Self-pay | Admitting: Family Medicine

## 2015-05-27 ENCOUNTER — Ambulatory Visit (INDEPENDENT_AMBULATORY_CARE_PROVIDER_SITE_OTHER): Payer: Commercial Managed Care - HMO | Admitting: Family Medicine

## 2015-05-27 VITALS — BP 130/84 | HR 114 | Temp 98.4°F | Resp 20 | Ht 60.0 in | Wt 229.1 lb

## 2015-05-27 DIAGNOSIS — G40109 Localization-related (focal) (partial) symptomatic epilepsy and epileptic syndromes with simple partial seizures, not intractable, without status epilepticus: Secondary | ICD-10-CM

## 2015-05-27 DIAGNOSIS — D509 Iron deficiency anemia, unspecified: Secondary | ICD-10-CM

## 2015-05-27 DIAGNOSIS — G8929 Other chronic pain: Secondary | ICD-10-CM | POA: Diagnosis not present

## 2015-05-27 DIAGNOSIS — M545 Low back pain: Secondary | ICD-10-CM | POA: Diagnosis not present

## 2015-05-27 MED ORDER — TRAMADOL HCL 50 MG PO TABS: 50.0000 mg | ORAL_TABLET | Freq: Four times a day (QID) | ORAL | Status: DC | PRN

## 2015-05-27 NOTE — Progress Notes (Signed)
Name: [name]   MRN: [id number]    DOB: November 17, 1965   Date:05/27/2015       Progress Note  Subjective  Chief Complaint  Chief Complaint  Patient presents with  . Back Pain    patient stated that she was discharged from the pain clinic since she missed 3 appts due to family issues. She will be relocating to [place], [place] in April and wanted to know if she could get something for pain until then.  . Medication Refill    patient stated that she is also out of her seizure medication. she had gotten some from the ER but they told her that she had to get a refill from her pcp.    HPI  Chronic Back Pain: secondary to a work related injury in 2001. A housekeeping cart fell on top of her back. She has been on disability since 2004.  She was seeing Dr. Wynona Canes at the pain clinic and receiving Tramadol to take 4 times daily. However she had multiple family emergencies and missed her appointments, therefore she has been dismissed by the pain clinic. She is moving to [place] in April and would like to have a refill until she can be seen by another provider over there.   Partial Seizure: she was seen at [facility], has an appointment with neurologist in May, but she will move before that time. She should have enough refills of keppra at the pharmacy. I will not adjust dose of medication, explained that she needs to see a neurologist. She states she still has episodes of jerking.   Iron Deficiency anemia: last hemoglobin was down to 9.0 , she is s/p bariatric surgery, per patient colonoscopy and EGD were done and normal, s/p hysterectomy. She has been taking iron supplementation   Patient Active Problem List   Diagnosis Date Noted  . Screening for STD (sexually transmitted disease) 04/13/2015  . Chronic pain 02/17/2015  . Long term current use of opiate analgesic 02/17/2015  . Long term prescription opiate use 02/17/2015  . Opiate use 02/17/2015  . Opiate dependence ([place]) 02/17/2015  .  Encounter for therapeutic drug level monitoring 02/17/2015  . Diabetic peripheral neuropathy ([place]) 02/17/2015  . Neurogenic pain 02/17/2015  . Musculoskeletal pain 02/17/2015  . Chronic lower extremity pain (Referred Pain) (Bilateral) (R>L) 02/17/2015  . Lumbar spondylosis 02/17/2015  . Osteoarthrosis 02/17/2015  . Lumbar facet syndrome (Bilateral) (R>L) 02/17/2015  . Chronic knee pain (Left) 02/17/2015  . 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 Tricompartmental disease of knee (Left) 02/17/2015  . Large breasts 02/10/2015  . Morbid obesity ([place]) 02/02/2015  . Thrombocytosis ([place]) 02/02/2015  . Iron deficiency anemia, unspecified 01/01/2015  . Hypokalemia 01/01/2015  . Insomnia 12/31/2014  . Nausea in adult patient 12/31/2014  . Non compliance with medical treatment 12/07/2014  . Chronic low back pain (LBP>LEP) (Bilateral) (R>L) 10/04/2014  . Lumbar DDD 09/08/2014  . Bipolar disorder ([place]) 09/08/2014  . Bipolar affective disorder ([place]) 04/08/2013  . History of bariatric surgery (2014) 05/02/2012  . Avitaminosis D 12/14/2011  . Polypharmacy 11/16/2011  . 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 Cyst of pancreas 11/12/2010  . Disorder of peripheral nervous system ([place]) 11/12/2010  . Apnea, sleep 11/12/2010  . Uncontrolled type 2 diabetes mellitus with diabetic neuropathy, with long-term current use of insulin ([place]) 11/12/2010    Past Surgical History  Procedure Laterality Date  . Gastric bypass    . Partial hysterectomy    .  Knee surgery    . Cholecystectomy    . Abdominal hysterectomy  08/11/2010    Partial    Family History  Problem Relation Age of Onset  . Stroke Mother   . Diabetes Mother   . Stroke Father   . Hypertension Father     Social History   Social History  . Marital Status: Married    Spouse Name: N/A  . Number of Children: N/A  . Years of Education: N/A   Occupational History  . Not  on file.   Social History Main Topics  . Smoking status: Never Smoker   . Smokeless tobacco: Never Used  . Alcohol Use: No  . Drug Use: No  . Sexual Activity:    Partners: Male    Birth Control/ Protection: Surgical   Other Topics Concern  . Not on file   Social History Narrative   Works- Disabled since 2003, back injury and neuropathy in legs   Lives with husband and 2 children    Pets: None   Caffeine- 1 soda, no coffee/tea, no chocolate    Education- 11 years    Right handed    Has walker and cane for mobility      Current outpatient prescriptions:  .  atorvastatin (LIPITOR) 80 MG tablet, Take 1 tablet by mouth daily., Disp: , Rfl: 7 .  B-D ULTRAFINE III SHORT PEN 31G X 8 MM MISC, USE TO ADMINISTER INSULIN FOUR TIMES DAILY AS DIRECTED, Disp: 100 each, Rfl: 5 .  ferrous sulfate 325 (65 FE) MG tablet, Take 1 tablet (325 mg total) by mouth daily with breakfast., Disp: 30 tablet, Rfl: 5 .  gabapentin (NEURONTIN) 800 MG tablet, Take 1 tablet (800 mg total) by mouth every 6 (six) hours., Disp: 120 tablet, Rfl: 0 .  hydrochlorothiazide (HYDRODIURIL) 25 MG tablet, Take 1 tablet by mouth daily., Disp: , Rfl: 1 .  insulin detemir (LEVEMIR) 100 UNIT/ML injection, Inject 60 Units into the skin daily., Disp: , Rfl:  .  lamoTRIgine (LAMICTAL) 100 MG tablet, , Disp: , Rfl:  .  levETIRAcetam (KEPPRA) 750 MG tablet, TAKE 1 TABLET(750 MG) BY MOUTH TWICE DAILY, Disp: 180 tablet, Rfl: 1 .  lisinopril (PRINIVIL,ZESTRIL) 10 MG tablet, , Disp: , Rfl:  .  metoprolol succinate (TOPROL-XL) 25 MG 24 hr tablet, , Disp: , Rfl:  .  NOVOLOG FLEXPEN 100 UNIT/ML FlexPen, INJECT UNDER THE SKIN AS DIRECTED. IF SUGAR 200-250: 10 UNITS, 251-300: 15 UNITS, 301-350: 20 UNITS, 351-400: 25 UNITS, Disp: 15 mL, Rfl: 0 .  omeprazole (PRILOSEC) 20 MG capsule, Take 20 mg by mouth 2 (two) times daily., Disp: , Rfl:  .  ondansetron (ZOFRAN) 8 MG tablet, TK 1 T PO Q 8 H PRN FOR NAUSEA OR VOMITING, Disp: , Rfl: 2 .   promethazine (PHENERGAN) 25 MG tablet, TAKE 1 TABLET BY MOUTH EVERY 8 HOURS AS NEEDED FOR NAUSEA, Disp: 60 tablet, Rfl: 2 .  QUEtiapine (SEROQUEL) 400 MG tablet, TAKE 2 TABLETS(800 MG) BY MOUTH AT BEDTIME, Disp: 180 tablet, Rfl: 2 .  sertraline (ZOLOFT) 100 MG tablet, Take 200 mg by mouth daily. , Disp: , Rfl:  .  tiZANidine (ZANAFLEX) 4 MG tablet, Take 1 tablet (4 mg total) by mouth every 6 (six) hours as needed for muscle spasms., Disp: 360 tablet, Rfl: 3 .  TRADJENTA 5 MG TABS tablet, , Disp: , Rfl:  .  traMADol (ULTRAM) 50 MG tablet, Take 2 tablets (100 mg total) by mouth every 6 (six) hours  as needed for severe pain., Disp: 240 tablet, Rfl: 0  Allergies  Allergen Reactions  . Penicillins Anaphylaxis  . Lyrica [Pregabalin]     Shaking, couldn't talk. Possbile seizure per pt.  . Morphine And Related Itching    Just morphine pt reports  . Reglan [Metoclopramide] Nausea And Vomiting    Pt. States "does something to my stomach" Pt. States "does something to my stomach" dizziness  . Tylenol [Acetaminophen]     Went into shock     ROS  Constitutional: Negative for fever , positive for mild weight change.  Respiratory: Negative for cough and shortness of breath.   Cardiovascular: Negative for chest pain or palpitations.  Gastrointestinal: Negative for abdominal pain, no bowel changes.  Musculoskeletal: Positive for gait problem or joint swelling.  Skin: Negative for rash.  Neurological: Positive  for dizziness but no  headaches.  No other specific complaints in a complete review of systems (except as listed in HPI above).  Objective  Filed Vitals:   05/27/15 0946  BP: 130/84  Pulse: 114  Temp: 98.4 F (36.9 C)  TempSrc: Oral  Resp: 20  Height: 5' (1.524 m)  Weight: 229 lb 1.6 oz (103.919 kg)  SpO2: 99%    Body mass index is 44.74 kg/(m^2).  Physical Exam  Constitutional: Patient appears well-developed and well-nourished. Obese  No distress.  HEENT: head atraumatic,  normocephalic, pupils equal and reactive to light, neck supple, throat within normal limits Cardiovascular: Normal rate, regular rhythm and normal heart sounds.  No murmur heard. No BLE edema. Pulmonary/Chest: Effort normal and breath sounds normal. No respiratory distress. Abdominal: Soft.  There is no tenderness. Psychiatric: Patient has a normal mood and affect. behavior is normal. Judgment and thought content normal. Muscular Skeletal: using a cane, tender during palpation of lumbar spine, negative straight leg raise  Recent Results (from the past 2160 hour(s))  CBC with Differential     Status: Abnormal   Collection Time: 04/12/15  3:14 PM  Result Value Ref Range   WBC 10.7 3.6 - 11.0 K/uL   RBC 4.16 3.80 - 5.20 MIL/uL   Hemoglobin 9.0 (L) 12.0 - 16.0 g/dL   HCT 29.5 (L) 35.0 - 47.0 %   MCV 70.9 (L) 80.0 - 100.0 fL   MCH 21.7 (L) 26.0 - 34.0 pg   MCHC 30.6 (L) 32.0 - 36.0 g/dL   RDW 20.7 (H) 11.5 - 14.5 %   Platelets 583 (H) 150 - 440 K/uL   Neutrophils Relative % 71 %   Lymphocytes Relative 24 %   Monocytes Relative 4 %   Eosinophils Relative 0 %   Basophils Relative 0 %   Band Neutrophils 0 %   Metamyelocytes Relative 1 %   Myelocytes 0 %   Promyelocytes Absolute 0 %   Blasts 0 %   nRBC 0 0 /100 WBC   Other 0 %   Neutro Abs 7.7 (H) 1.4 - 6.5 K/uL   Lymphs Abs 2.6 1.0 - 3.6 K/uL   Monocytes Absolute 0.4 0.2 - 0.9 K/uL   Eosinophils Absolute 0.0 0 - 0.7 K/uL   Basophils Absolute 0.0 0 - 0.1 K/uL   Smear Review MILD LEFT SHIFT (1-5% METAS, OCC MYELO, OCC BANDS)     Comment: VACUOLATED NEUTROPHILS  Basic metabolic panel     Status: Abnormal   Collection Time: 04/12/15  3:14 PM  Result Value Ref Range   Sodium 138 135 - 145 mmol/L   Potassium 3.3 (L) 3.5 -  5.1 mmol/L   Chloride 102 101 - 111 mmol/L   CO2 26 22 - 32 mmol/L   Glucose, Bld 130 (H) 65 - 99 mg/dL   BUN 15 6 - 20 mg/dL   Creatinine, Ser 0.90 0.44 - 1.00 mg/dL   Calcium 9.3 8.9 - 10.3 mg/dL   GFR calc non  Af Amer >60 >60 mL/min   GFR calc Af Amer >60 >60 mL/min    Comment: (NOTE) The eGFR has been calculated using the CKD EPI equation. This calculation has not been validated in all clinical situations. eGFR's persistently <60 mL/min signify possible Chronic Kidney Disease.    Anion gap 10 5 - 15  Hemoglobin A1c     Status: Abnormal   Collection Time: 04/13/15  9:05 AM  Result Value Ref Range   Hgb A1c MFr Bld 6.7 (H) 4.8 - 5.6 %    Comment:          Pre-diabetes: 5.7 - 6.4          Diabetes: >6.4          Glycemic control for adults with diabetes: <7.0    Est. average glucose Bld gHb Est-mCnc 146 mg/dL  HIV antibody     Status: None   Collection Time: 04/13/15  9:05 AM  Result Value Ref Range   HIV Screen 4th Generation wRfx Non Reactive Non Reactive  Basic Metabolic Panel (BMET)     Status: Abnormal   Collection Time: 04/13/15  9:05 AM  Result Value Ref Range   Glucose 152 (H) 65 - 99 mg/dL   BUN 12 6 - 24 mg/dL   Creatinine, Ser 1.02 (H) 0.57 - 1.00 mg/dL   GFR calc non Af Amer 66 >59 mL/min/1.73   GFR calc Af Amer 76 >59 mL/min/1.73   BUN/Creatinine Ratio 12 9 - 23   Sodium 139 134 - 144 mmol/L   Potassium 3.8 3.5 - 5.2 mmol/L   Chloride 97 96 - 106 mmol/L   CO2 26 18 - 29 mmol/L   Calcium 9.5 8.7 - 10.2 mg/dL     PHQ2/9: Depression screen [facility] 2/9 05/27/2015 02/17/2015 12/31/2014  Decreased Interest 0 0 0  Down, Depressed, Hopeless 3 0 0  PHQ - 2 Score 3 0 0  Altered sleeping 2 - -  Tired, decreased energy 2 - -  Change in appetite 0 - -  Feeling bad or failure about yourself  0 - -  Trouble concentrating 1 - -  Moving slowly or fidgety/restless 0 - -  Suicidal thoughts 0 - -  PHQ-9 Score 8 - -     Fall Risk: Fall Risk  05/27/2015 02/17/2015 12/31/2014  Falls in the past year? No No Yes  Number falls in past yr: - - 2 or more  Injury with Fall? - - Yes  Risk Factor Category  - - High Fall Risk  Risk for fall due to : - - History of fall(s);Impaired  balance/gait;Impaired mobility      Functional Status Survey: Is the patient deaf or have difficulty hearing?: No Does the patient have difficulty seeing, even when wearing glasses/contacts?: No Does the patient have difficulty concentrating, remembering, or making decisions?: No Does the patient have difficulty walking or climbing stairs?: Yes (due to back pain) Does the patient have difficulty dressing or bathing?: No Does the patient have difficulty doing errands alone such as visiting a doctor's office or shopping?: No    Assessment & Plan  1. Chronic low back  pain  Pain level right now is 8/10, out of medication for two weeks, explained that I don't feel comfortable giving her 240 pills. Advised to try taking one four times a day until she finds a pcp in [place]. - traMADol (ULTRAM) 50 MG tablet; Take 1 tablet (50 mg total) by mouth every 6 (six) hours as needed for severe pain.  Dispense: 120 tablet; Refill: 0  2. 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)  Continue medication and needs follow up with Neurologist  3. Iron deficiency anemia  Discussed importance of iron supplementation

## 2015-06-02 ENCOUNTER — Ambulatory Visit: Payer: Commercial Managed Care - HMO | Admitting: Neurology

## 2015-06-03 ENCOUNTER — Other Ambulatory Visit: Payer: Self-pay | Admitting: Nurse Practitioner

## 2015-06-04 ENCOUNTER — Other Ambulatory Visit: Payer: Self-pay

## 2015-06-04 NOTE — Telephone Encounter (Signed)
Patient has been dismissed.  Medication not refilled.

## 2015-06-18 ENCOUNTER — Other Ambulatory Visit: Payer: Self-pay

## 2015-06-18 MED ORDER — TRADJENTA 5 MG PO TABS: 5.0000 mg | ORAL_TABLET | Freq: Every day | ORAL | Status: DC

## 2015-06-18 NOTE — Telephone Encounter (Signed)
Chart documents type 2 DM; Rx approved

## 2015-06-24 ENCOUNTER — Other Ambulatory Visit: Payer: Self-pay

## 2015-06-24 MED ORDER — INSULIN ASPART 100 UNIT/ML FLEXPEN: PEN_INJECTOR | SUBCUTANEOUS | Status: DC

## 2015-06-24 NOTE — Telephone Encounter (Signed)
Last A1c reviewed; rx approved

## 2015-06-26 ENCOUNTER — Encounter: Payer: Self-pay | Admitting: Family Medicine

## 2015-06-26 ENCOUNTER — Ambulatory Visit (INDEPENDENT_AMBULATORY_CARE_PROVIDER_SITE_OTHER): Payer: Commercial Managed Care - HMO | Admitting: Family Medicine

## 2015-06-26 VITALS — BP 118/80 | HR 93 | Temp 98.5°F | Resp 18 | Wt 233.2 lb

## 2015-06-26 DIAGNOSIS — M545 Low back pain, unspecified: Secondary | ICD-10-CM

## 2015-06-26 DIAGNOSIS — D649 Anemia, unspecified: Secondary | ICD-10-CM | POA: Insufficient documentation

## 2015-06-26 DIAGNOSIS — N63 Unspecified lump in breast: Secondary | ICD-10-CM

## 2015-06-26 DIAGNOSIS — Z124 Encounter for screening for malignant neoplasm of cervix: Secondary | ICD-10-CM | POA: Diagnosis not present

## 2015-06-26 DIAGNOSIS — E785 Hyperlipidemia, unspecified: Secondary | ICD-10-CM | POA: Insufficient documentation

## 2015-06-26 DIAGNOSIS — N6321 Unspecified lump in the left breast, upper outer quadrant: Secondary | ICD-10-CM | POA: Insufficient documentation

## 2015-06-26 DIAGNOSIS — Z5181 Encounter for therapeutic drug level monitoring: Secondary | ICD-10-CM | POA: Diagnosis not present

## 2015-06-26 DIAGNOSIS — N9489 Other specified conditions associated with female genital organs and menstrual cycle: Secondary | ICD-10-CM | POA: Diagnosis not present

## 2015-06-26 DIAGNOSIS — N62 Hypertrophy of breast: Secondary | ICD-10-CM

## 2015-06-26 DIAGNOSIS — Z0000 Encounter for general adult medical examination without abnormal findings: Secondary | ICD-10-CM | POA: Insufficient documentation

## 2015-06-26 DIAGNOSIS — G8929 Other chronic pain: Secondary | ICD-10-CM | POA: Diagnosis not present

## 2015-06-26 DIAGNOSIS — N6322 Unspecified lump in the left breast, upper inner quadrant: Secondary | ICD-10-CM

## 2015-06-26 DIAGNOSIS — N6315 Unspecified lump in the right breast, overlapping quadrants: Secondary | ICD-10-CM | POA: Insufficient documentation

## 2015-06-26 DIAGNOSIS — N898 Other specified noninflammatory disorders of vagina: Secondary | ICD-10-CM

## 2015-06-26 DIAGNOSIS — N6310 Unspecified lump in the right breast, unspecified quadrant: Secondary | ICD-10-CM

## 2015-06-26 MED ORDER — TRAMADOL HCL 50 MG PO TABS: 50.0000 mg | ORAL_TABLET | Freq: Four times a day (QID) | ORAL | Status: DC | PRN

## 2015-06-26 NOTE — Progress Notes (Signed)
Patient ID: [name], female   DOB: 1966/02/21, 50 y.o.   MRN: [id number]   Subjective:   [name] is a 50 y.o. female here for a complete physical exam  Interim issues since last visit: family loss, moving to [place] soon with her husband to care for family member; she was told she is anemic and was advised to take iron; she has back pain and needs refill of tramadol; she denies any feelings like she is taking inappropriate, denies taking to get high, no red flags  USPSTF grade A and B recommendations Alcohol: no Depression:  Depression screen [facility] 2/9 06/26/2015 05/27/2015 02/17/2015 12/31/2014  Decreased Interest 1 0 0 0  Down, Depressed, Hopeless 2 3 0 0  PHQ - 2 Score 3 3 0 0  Altered sleeping 0 2 - -  Tired, decreased energy 3 2 - -  Change in appetite 0 0 - -  Feeling bad or failure about yourself  0 0 - -  Trouble concentrating 0 1 - -  Moving slowly or fidgety/restless 0 0 - -  Suicidal thoughts 0 0 - -  PHQ-9 Score 6 8 - -  Difficult doing work/chores Somewhat difficult - - -  sees psychiatrist for mood  Hypertension: well-controlled today Obesity: working on weight loss, patient does not need help when offered Tobacco use: no HIV, hep B, hep C: neg hiv STD testing and prevention (chl/gon/syphilis): n/a Lipids: check today Glucose: a1c wellcontrolled in Jan Colorectal cancer: had colonoscopy Breast cancer: no lumps, mammo UTD BRCA gene screening: aunt breast cancer, no others Intimate partner violence: no Cervical cancer screening: today Lung cancer: n/a Osteoporosis: n/a Fall prevention/vitamin D: recommend 1000 iu vit D AAA: n/a Aspirin: n/a Diet: good diet Exercise: regular activity, walks in the house Skin cancer: no worrisome moles  Has gabapentin and tramadol for pain in her back; 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 Diabetes mellitus without complication ([place])   . Hypercholesteremia   . Bipolar 1 disorder  ([place])   . Arthritis   . Depression   . Blood transfusion without reported diagnosis   . Chicken pox   . History of stomach ulcers   . History of fainting spells of unknown cause   . Diabetes mellitus, type 2 ([place]) 11/12/2010  . 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 Alease Frame   . Disorder of peripheral nervous system ([place]) 11/12/2010  . Fibroid 11/12/2010    Overview:  Uterine fibroids with menorrhagia.  Fe def anemia.  GYN: Dr. Robina Ade.  Robotically-assisted total laparoscopic hysterectomy, aspiration of left ovarian cyst, cystoscopy. 08/11/09@[facility] .   . Back pain 09/08/2014  . Apnea, sleep 11/12/2010    Overview:  Overview:  A.  Sleep apnea.  Stated prev was diagnosed with sleep apnea in [place], [place] but moved to [place] and did not follow through with additional testing. ++++++++++++++++++++++++++++++++++++++ November 13, 2011 NPSG @ [facility]  overall AHI 2/hr; O2 nadir 83%. No supine sleep captured; side AHI: 1.9/hr; REM AHI 17.4/hr. ESS: 9/24; BMI 53.9 kg/m2; Wei  . Seizures [facility])     last seizure was August 2016   Past Surgical History  Procedure Laterality Date  . Gastric bypass    . Partial hysterectomy    . Knee surgery    . Cholecystectomy    . Abdominal hysterectomy  08/11/2010    Partial   Family History  Problem Relation Age of Onset  . Stroke Mother   . Diabetes Mother   . Stroke Father   . Hypertension Father    Social History  Substance Use Topics  . Smoking status: Never Smoker   . Smokeless tobacco: Never Used  . Alcohol Use: No   Review of Systems  Constitutional: Negative for unexpected weight change.  HENT: Negative for hearing loss.   Eyes: Negative for visual disturbance.  Cardiovascular: Negative for leg swelling.  Gastrointestinal: Negative for blood in stool.       Trouble with one pill, got hung, getting better; no chronic heartburn or rfelux  Endocrine: Negative for polydipsia.   Genitourinary: Negative for hematuria and vaginal discharge.  Musculoskeletal: Positive for back pain.  Allergic/Immunologic: Negative for food allergies.  Hematological: Negative for adenopathy. Does not bruise/bleed easily.  Psychiatric/Behavioral: Negative for hallucinations.    Objective:    Today's Vitals   06/26/15 0751 06/26/15 0752 06/26/15 0928  BP: 118/80    Pulse: 115  93  Temp: 98.5 F (36.9 C)    TempSrc: Oral    Resp: 18    Weight: 233 lb 3.2 oz (105.779 kg)    SpO2: 99%    PainSc:  6      Body mass index is 45.54 kg/(m^2). Wt Readings from Last 3 Encounters:  06/26/15 233 lb 3.2 oz (105.779 kg)  05/27/15 229 lb 1.6 oz (103.919 kg)  04/13/15 225 lb (102.059 kg)   Physical Exam  Constitutional: She appears well-developed and well-nourished.  HENT:  Head: Normocephalic and atraumatic.  Eyes: Conjunctivae and EOM are normal. Right eye exhibits no hordeolum. Left eye exhibits no hordeolum. No scleral icterus.  Neck: Carotid bruit is not present. No thyromegaly present.  Cardiovascular: Normal rate, regular rhythm, S1 normal, S2 normal and normal heart sounds.   No extrasystoles are present.  Pulmonary/Chest: Effort normal and breath sounds normal. No respiratory distress. Right breast exhibits mass (9 and 10 o'clock positions; 9 o'clock is about 6 mm round, 10 o'clock is more linear). Right breast exhibits no inverted nipple, no nipple discharge, no skin change and no tenderness. Left breast exhibits mass (2 o'clock position UOQ, linear dense tissue). Left breast exhibits no inverted nipple, no nipple discharge, no skin change and no tenderness. Breasts are symmetrical.  Large, pendulous breasts  Abdominal: Soft. Normal appearance and bowel sounds are normal. She exhibits no distension, no abdominal bruit, no pulsatile midline mass and no mass. There is no hepatosplenomegaly. There is no tenderness. No hernia.  Genitourinary: Pelvic exam was performed with patient  prone. There is no rash or lesion on the right labia. There is no rash or lesion on the left labia. Right adnexum displays no mass, no tenderness and no fullness. Left adnexum displays no mass, no tenderness and no fullness. No erythema or tenderness in the vagina. No signs of injury around the vagina. No vaginal discharge found.  Cervix not visualized; grey-violaceous papule at 6 o'clock near introitus about 7-8 mm diameter; nontender  Musculoskeletal: Normal range of motion. She exhibits no edema.  Lymphadenopathy:       Head (right side): No submandibular adenopathy present.       Head (left side): No submandibular adenopathy present.    She has no cervical adenopathy.    She has no axillary adenopathy.  Neurological: She is alert. She displays no tremor. No cranial nerve deficit. She exhibits normal muscle tone. Gait abnormal.  Ambulatory with cane; no tics  Skin: Skin  is warm and dry. No bruising and no ecchymosis noted. No cyanosis. No pallor.  Psychiatric: Her speech is normal and behavior is normal. Thought content normal. Her mood appears not anxious. She does not exhibit a depressed mood.  Good eye contact with examiner, pleasant, cooperative    Assessment/Plan:   Problem List Items Addressed This Visit      Other   Chronic low back pain (LBP>LEP) (Bilateral) (R>L) (Chronic)   Relevant Medications   meloxicam (MOBIC) 15 MG tablet   traMADol (ULTRAM) 50 MG tablet   Encounter for therapeutic drug level monitoring    Monitor opiate; UDS      Relevant Orders   Opiate, quantitative, urine   Anemia    Patient reports colonoscopy is UTD; no bleeding source known; reviewed last 3 CBCs; will get stool cards (given to patient today), CBC, iron, TIBC, ferritin; patient was advised that with her move, she really needs to follow up with someone in [place] about this soon      Relevant Orders   CBC with Differential/Platelet   Ferritin   Iron Binding Cap (TIBC)   Hyperlipidemia     Check lipids today; limit saturated fats; she is working on weight loss      Relevant Orders   Lipid Panel w/o Chol/HDL Ratio   Breast hypertrophy    Patient is hoping for breast reduction; she has shoulder pain and back pain; also with grooves with shoulders from bra straps; I support her wish for reduction mammoplasty, medically indicated      Vaginal mass    Near introitus; refer to gynecologist for evaluation, consideration of biopsy/excision      Relevant Orders   Ambulatory referral to Gynecology   Breast lump on right side at 9 o'clock position   Relevant Orders   US BREAST LTD UNI RIGHT INC AXILLA   MM DIAG BREAST TOMO BILATERAL   Breast lump on left side at 2 o'clock position    Diagnostic mammo and [place] (10 o'clock position was inadvertently put on original order; submitting new order for 2 o'clock position)      Relevant Orders   US BREAST LTD UNI LEFT INC AXILLA   Medication monitoring encounter   Relevant Orders   Comprehensive metabolic panel   Encounter for screening for cervical cancer    Relevant Orders   IGP, rfx Aptima HPV ASCU   Preventative health care - Primary    USPSTF grade A and B recommendations reviewed with patient; age-appropriate recommendations, preventive care, screening tests, etc discussed and encouraged; healthy living encouraged          Meds ordered this encounter  Medications  . meloxicam (MOBIC) 15 MG tablet    Sig:     Refill:  5  . traMADol (ULTRAM) 50 MG tablet    Sig: Take 1 tablet (50 mg total) by mouth every 6 (six) hours as needed for severe pain.    Dispense:  120 tablet    Refill:  0   Orders Placed This Encounter  Procedures  . US BREAST LTD UNI RIGHT INC AXILLA    Order Specific Question:  Reason for Exam (SYMPTOM  OR DIAGNOSIS REQUIRED)    Answer:  breast lump RIGHT 9 and 10 o'clock positions    Order Specific Question:  Preferred imaging location?    Answer:  [facility]  . MM DIAG BREAST TOMO BILATERAL     Order Specific Question:  Reason for Exam (SYMPTOM  OR DIAGNOSIS REQUIRED)  Answer:  breast lumps RIGHT and LEFT breasts    Order Specific Question:  Is the patient pregnant?    Answer:  No    Order Specific Question:  Preferred imaging location?    Answer:  [facility]  . US BREAST LTD UNI LEFT INC AXILLA    Scheduling Instructions:     Corrected order    Order Specific Question:  Reason for Exam (SYMPTOM  OR DIAGNOSIS REQUIRED)    Answer:  breast lump LEFT 2 o'clock position, NOT 10 o'clock position    Order Specific Question:  Preferred imaging location?    Answer:  [facility]  . CBC with Differential/Platelet  . Lipid Panel w/o Chol/HDL Ratio  . Ferritin  . Iron Binding Cap (TIBC)  . Opiate, quantitative, urine  . Comprehensive metabolic panel    Order Specific Question:  Has the patient fasted?    Answer:  Yes  . Ambulatory referral to Gynecology    Referral Priority:  Routine    Referral Type:  Consultation    Referral Reason:  Specialty Services Required    Requested Specialty:  Gynecology    Number of Visits Requested:  1    Follow up plan: Return if symptoms worsen or fail to improve, for you are always welcome back here if you move back to the area.  An after-visit summary was printed and given to the patient at [facility].  Please see the patient instructions which may contain other information and recommendations beyond what is mentioned above in the assessment and plan.  Meds ordered this encounter  Medications  . meloxicam (MOBIC) 15 MG tablet    Sig:     Refill:  5  . traMADol (ULTRAM) 50 MG tablet    Sig: Take 1 tablet (50 mg total) by mouth every 6 (six) hours as needed for severe pain.    Dispense:  120 tablet    Refill:  0   Orders Placed This Encounter  Procedures  . US BREAST LTD UNI RIGHT INC AXILLA  . MM DIAG BREAST TOMO BILATERAL  . US BREAST LTD UNI LEFT INC AXILLA  . CBC with Differential/Platelet  . Lipid Panel w/o  Chol/HDL Ratio  . Ferritin  . Iron Binding Cap (TIBC)  . Opiate, quantitative, urine  . Comprehensive metabolic panel  . Ambulatory referral to Gynecology

## 2015-06-26 NOTE — Assessment & Plan Note (Signed)
Near introitus; refer to gynecologist for evaluation, consideration of biopsy/excision

## 2015-06-26 NOTE — Assessment & Plan Note (Signed)
Diagnostic mammo and [place] (10 o'clock position was inadvertently put on original order; submitting new order for 2 o'clock position)

## 2015-06-26 NOTE — Assessment & Plan Note (Signed)
Monitor opiate; UDS

## 2015-06-26 NOTE — Assessment & Plan Note (Addendum)
Patient is hoping for breast reduction; she has shoulder pain and back pain; also with grooves with shoulders from bra straps; I support her wish for reduction mammoplasty, medically indicated

## 2015-06-26 NOTE — Assessment & Plan Note (Signed)
Check lipids today; limit saturated fats; she is working on weight loss

## 2015-06-26 NOTE — Assessment & Plan Note (Signed)
USPSTF grade A and B recommendations reviewed with patient; age-appropriate recommendations, preventive care, screening tests, etc discussed and encouraged; healthy living encouraged

## 2015-06-26 NOTE — Patient Instructions (Addendum)
You'll be due for your next A1c on or after October 12, 2015 [facility] refer you to see a gynecologist We'll order breast imaging We'll contact you about your labs drawn today You may call for a refill of pain medicine to take with you before your move We wish you the best Please do the stool cards (return the packet at your earliest convenience) Please follow-up with your new provider about all of your medical conditions, including your anemia, within six weeks

## 2015-06-26 NOTE — Assessment & Plan Note (Signed)
Patient reports colonoscopy is UTD; no bleeding source known; reviewed last 3 CBCs; will get stool cards (given to patient today), CBC, iron, TIBC, ferritin; patient was advised that with her move, she really needs to follow up with someone in [place] about this soon

## 2015-07-01 ENCOUNTER — Other Ambulatory Visit: Payer: Self-pay | Admitting: Pain Medicine

## 2015-07-01 ENCOUNTER — Other Ambulatory Visit: Payer: Self-pay | Admitting: Family Medicine

## 2015-07-01 ENCOUNTER — Encounter: Payer: Self-pay | Admitting: Family Medicine

## 2015-07-01 DIAGNOSIS — G8929 Other chronic pain: Secondary | ICD-10-CM

## 2015-07-01 DIAGNOSIS — M545 Low back pain: Principal | ICD-10-CM

## 2015-07-01 LAB — IGP, RFX APTIMA HPV ASCU: PAP SMEAR COMMENT: 0

## 2015-07-01 NOTE — Telephone Encounter (Signed)
She CANNOT refill the tramadol until I get a urine drug screen on her; no results are back and I asked her to go have that done at last visit I called pharmacy to get fill hx Gabapentin last prescribed by Dr. Dossie Arbour 800 qid 120 06/07/15; NO refill authorized I explained to pharmacist, too early; she can ask again to get that filled on or after 06/06/15, but not sooner Tramadol just filled so NO to that request too; I will NOT refill tramadol until I get the results of the urine drug screen back; if no UDS, no Rx ------------------- Roselyn Reef, I do not have patient's lab results from last week (lipid, CMP, CBC, iron, TIBC, ferritin, UDS) Please resolve with patient and/or lab I will not write her any more tramadol until I get the results of that UDS

## 2015-07-01 NOTE — Telephone Encounter (Signed)
Patient is moving and was told to give [place] a call a week before moving to get refills on her medications. She is requesting that you send tramadol and gabapentin both with a 90day supply to be sent to [facility]

## 2015-07-01 NOTE — Telephone Encounter (Signed)
Pt notified and will get blood work done on Monday.

## 2015-07-07 ENCOUNTER — Telehealth: Payer: Self-pay | Admitting: Family Medicine

## 2015-07-07 LAB — OPIATE, QUANTITATIVE, URINE

## 2015-07-07 LAB — COMPREHENSIVE METABOLIC PANEL WITH GFR
ALT: 18 IU/L (ref 0–32)
AST: 29 IU/L (ref 0–40)
Albumin/Globulin Ratio: 1.3 (ref 1.2–2.2)
Albumin: 4.5 g/dL (ref 3.5–5.5)
Alkaline Phosphatase: 135 IU/L — ABNORMAL HIGH (ref 39–117)
BUN/Creatinine Ratio: 12 (ref 9–23)
BUN: 13 mg/dL (ref 6–24)
Bilirubin Total: 0.2 mg/dL (ref 0.0–1.2)
CO2: 24 mmol/L (ref 18–29)
Calcium: 9.4 mg/dL (ref 8.7–10.2)
Chloride: 93 mmol/L — ABNORMAL LOW (ref 96–106)
Creatinine, Ser: 1.12 mg/dL — ABNORMAL HIGH (ref 0.57–1.00)
GFR calc Af Amer: 68 mL/min/1.73
GFR calc non Af Amer: 59 mL/min/1.73 — ABNORMAL LOW
Globulin, Total: 3.5 g/dL (ref 1.5–4.5)
Glucose: 153 mg/dL — ABNORMAL HIGH (ref 65–99)
Potassium: 4.3 mmol/L (ref 3.5–5.2)
Sodium: 137 mmol/L (ref 134–144)
Total Protein: 8 g/dL (ref 6.0–8.5)

## 2015-07-07 LAB — FERRITIN: Ferritin: 10 ng/mL — ABNORMAL LOW (ref 15–150)

## 2015-07-07 LAB — IRON AND TIBC
IRON SATURATION: 3 % — AB (ref 15–55)
IRON: 15 ug/dL — AB (ref 27–159)
Total Iron Binding Capacity: 524 ug/dL — ABNORMAL HIGH (ref 250–450)
UIBC: 509 ug/dL — ABNORMAL HIGH (ref 131–425)

## 2015-07-07 NOTE — Telephone Encounter (Signed)
Please contact lab and find out why they didn't do her UDS; thanks

## 2015-07-07 NOTE — Telephone Encounter (Signed)
Thank you; unfortunately, no UDS, so no pain medicine will be provided She'll need to talk with [place] next provider [place] to decide what to do for her pain

## 2015-07-07 NOTE — Telephone Encounter (Signed)
Yes I notified her yesterday if she didn't get a urine, then you wouldn't give pain meds

## 2015-07-07 NOTE — Telephone Encounter (Signed)
Patient waited 3 hours drinking water and could not urinate.

## 2015-07-08 ENCOUNTER — Telehealth: Payer: Self-pay | Admitting: Family Medicine

## 2015-07-08 MED ORDER — POLYSACCHARIDE IRON COMPLEX 150 MG PO CAPS: 150.0000 mg | ORAL_CAPSULE | Freq: Every day | ORAL | Status: AC

## 2015-07-08 NOTE — Telephone Encounter (Signed)
I still don't have patient's CBC or lipid panel or urine drug screen I called patient; she says they drew two tubes of blood; she brought her urine back too I explained I am very concerned about her low iron and ferritin; she appears to have significant iron deficiency anemia; it was down to 9 two months ago, and I need to know what it is now She thinks she has had a colonoscopy, but I don't see any record of it in Epic / [facility] She is moving to [place] next week; I told her this absolutely needs to be worked up; if the work-up is not completed before she moves, she absolutely needs to get established with someone in [place] right away to find out what's going on; discussed the risk of something serious like a stomach ulcer or colon cancer, e.g.; she agrees I'll ask staff to contact [facility] and then bring her back in for STAT CBC if they don't have those results tomorrow; pt agrees; start iron, eat greens --------------------- Roselyn Reef, Please contact [facility] first thing Thursday morning Find out CBC results; if they don't have them, pt needs CBC STAT Also, please check records for colonoscopy; pt thinks she has had one, but I don't see in surg hx or in procedures tab, but I do see a Dr. Allen Norris entry for Jan 2015 (conversion) Thank you!

## 2015-07-09 ENCOUNTER — Other Ambulatory Visit: Payer: Self-pay

## 2015-07-09 DIAGNOSIS — D509 Iron deficiency anemia, unspecified: Secondary | ICD-10-CM

## 2015-07-09 NOTE — Telephone Encounter (Signed)
For some reason CBC was not collected? Lab faxed over results on your desk.  Please order stat CBC.  Read previous documentations

## 2015-07-09 NOTE — Telephone Encounter (Signed)
[employer] is faxing over all labs except urine drug screen, is still pending.

## 2015-07-09 NOTE — Telephone Encounter (Signed)
Patient is not in our old system. We have no record of colonoscopy.

## 2015-07-09 NOTE — Telephone Encounter (Signed)
Patient was cautioned about risk, needs CBC done

## 2015-07-09 NOTE — Telephone Encounter (Signed)
Call Dr. Allen Norris office and patient had an upper endoscopy and a colonoscopy on 03/26/2013 they are sending records. Patient did not do prep and had stool in her entire colon, so they wanted to repeat and patient has not gone back to do another one. Please read previous documentation.

## 2015-07-10 ENCOUNTER — Other Ambulatory Visit: Payer: Self-pay

## 2015-07-10 ENCOUNTER — Inpatient Hospital Stay
Admission: EM | Admit: 2015-07-10 | Discharge: 2015-07-11 | DRG: 812 | Disposition: A | Discharge status: Home / Self Care | Payer: Commercial Managed Care - HMO | Attending: Internal Medicine | Admitting: Internal Medicine

## 2015-07-10 ENCOUNTER — Telehealth: Payer: Self-pay | Admitting: Family Medicine

## 2015-07-10 DIAGNOSIS — Z888 Allergy status to other drugs, medicaments and biological substances status: Secondary | ICD-10-CM | POA: Diagnosis not present

## 2015-07-10 DIAGNOSIS — G40909 Epilepsy, unspecified, not intractable, without status epilepticus: Secondary | ICD-10-CM | POA: Diagnosis present

## 2015-07-10 DIAGNOSIS — Z8711 Personal history of peptic ulcer disease: Secondary | ICD-10-CM

## 2015-07-10 DIAGNOSIS — Z794 Long term (current) use of insulin: Secondary | ICD-10-CM

## 2015-07-10 DIAGNOSIS — E7800 Pure hypercholesterolemia, unspecified: Secondary | ICD-10-CM | POA: Diagnosis present

## 2015-07-10 DIAGNOSIS — R0609 Other forms of dyspnea: Secondary | ICD-10-CM | POA: Diagnosis not present

## 2015-07-10 DIAGNOSIS — Z8249 Family history of ischemic heart disease and other diseases of the circulatory system: Secondary | ICD-10-CM

## 2015-07-10 DIAGNOSIS — Z823 Family history of stroke: Secondary | ICD-10-CM | POA: Diagnosis not present

## 2015-07-10 DIAGNOSIS — E1140 Type 2 diabetes mellitus with diabetic neuropathy, unspecified: Secondary | ICD-10-CM | POA: Diagnosis present

## 2015-07-10 DIAGNOSIS — D509 Iron deficiency anemia, unspecified: Secondary | ICD-10-CM | POA: Diagnosis present

## 2015-07-10 DIAGNOSIS — Z79899 Other long term (current) drug therapy: Secondary | ICD-10-CM | POA: Diagnosis not present

## 2015-07-10 DIAGNOSIS — R5383 Other fatigue: Secondary | ICD-10-CM | POA: Diagnosis not present

## 2015-07-10 DIAGNOSIS — I4581 Long QT syndrome: Secondary | ICD-10-CM | POA: Diagnosis present

## 2015-07-10 DIAGNOSIS — N179 Acute kidney failure, unspecified: Secondary | ICD-10-CM | POA: Diagnosis present

## 2015-07-10 DIAGNOSIS — G8929 Other chronic pain: Secondary | ICD-10-CM

## 2015-07-10 DIAGNOSIS — Z9884 Bariatric surgery status: Secondary | ICD-10-CM | POA: Diagnosis not present

## 2015-07-10 DIAGNOSIS — Z885 Allergy status to narcotic agent status: Secondary | ICD-10-CM | POA: Diagnosis not present

## 2015-07-10 DIAGNOSIS — I10 Essential (primary) hypertension: Secondary | ICD-10-CM | POA: Diagnosis present

## 2015-07-10 DIAGNOSIS — Z833 Family history of diabetes mellitus: Secondary | ICD-10-CM | POA: Diagnosis not present

## 2015-07-10 DIAGNOSIS — Z90710 Acquired absence of both cervix and uterus: Secondary | ICD-10-CM

## 2015-07-10 DIAGNOSIS — G4730 Sleep apnea, unspecified: Secondary | ICD-10-CM | POA: Diagnosis present

## 2015-07-10 DIAGNOSIS — Z880 Allergy status to penicillin: Secondary | ICD-10-CM | POA: Diagnosis not present

## 2015-07-10 DIAGNOSIS — R9431 Abnormal electrocardiogram [ECG] [EKG]: Secondary | ICD-10-CM

## 2015-07-10 DIAGNOSIS — D649 Anemia, unspecified: Secondary | ICD-10-CM

## 2015-07-10 DIAGNOSIS — M1990 Unspecified osteoarthritis, unspecified site: Secondary | ICD-10-CM | POA: Diagnosis present

## 2015-07-10 LAB — CBC WITH DIFFERENTIAL/PLATELET
BASOS: 1 %
Basophils Absolute: 0 10*3/uL (ref 0.0–0.2)
EOS (ABSOLUTE): 0.1 10*3/uL (ref 0.0–0.4)
EOS: 1 %
HEMATOCRIT: 23.6 % — AB (ref 34.0–46.6)
HEMOGLOBIN: 7 g/dL — AB (ref 11.1–15.9)
IMMATURE GRANS (ABS): 0 10*3/uL (ref 0.0–0.1)
Immature Granulocytes: 0 %
Lymphocytes Absolute: 3 10*3/uL (ref 0.7–3.1)
Lymphs: 37 %
MCH: 19 pg — ABNORMAL LOW (ref 26.6–33.0)
MCHC: 29.7 g/dL — ABNORMAL LOW (ref 31.5–35.7)
MCV: 64 fL — AB (ref 79–97)
MONOCYTES: 8 %
MONOS ABS: 0.6 10*3/uL (ref 0.1–0.9)
Neutrophils Absolute: 4.3 10*3/uL (ref 1.4–7.0)
Neutrophils: 53 %
Platelets: 453 10*3/uL — ABNORMAL HIGH (ref 150–379)
RBC: 3.69 x10E6/uL — ABNORMAL LOW (ref 3.77–5.28)
RDW: 18 % — AB (ref 12.3–15.4)
WBC: 8.1 10*3/uL (ref 3.4–10.8)

## 2015-07-10 LAB — CBC
HCT: 22.8 % — ABNORMAL LOW (ref 35.0–47.0)
HEMOGLOBIN: 6.9 g/dL — AB (ref 12.0–16.0)
MCH: 19.3 pg — AB (ref 26.0–34.0)
MCHC: 30.4 g/dL — AB (ref 32.0–36.0)
MCV: 63.6 fL — ABNORMAL LOW (ref 80.0–100.0)
PLATELETS: 454 10*3/uL — AB (ref 150–440)
RBC: 3.59 MIL/uL — ABNORMAL LOW (ref 3.80–5.20)
RDW: 18.9 % — AB (ref 11.5–14.5)
WBC: 7.4 10*3/uL (ref 3.6–11.0)

## 2015-07-10 LAB — BASIC METABOLIC PANEL
Anion gap: 11 (ref 5–15)
BUN: 15 mg/dL (ref 6–20)
CALCIUM: 9 mg/dL (ref 8.9–10.3)
CHLORIDE: 101 mmol/L (ref 101–111)
CO2: 24 mmol/L (ref 22–32)
CREATININE: 1.33 mg/dL — AB (ref 0.44–1.00)
GFR calc Af Amer: 54 mL/min — ABNORMAL LOW (ref >60)
GFR calc non Af Amer: 47 mL/min — ABNORMAL LOW (ref >60)
Glucose, Bld: 108 mg/dL — ABNORMAL HIGH (ref 65–99)
Potassium: 3.9 mmol/L (ref 3.5–5.1)
SODIUM: 136 mmol/L (ref 135–145)

## 2015-07-10 LAB — GLUCOSE, CAPILLARY
Glucose-Capillary: 130 mg/dL — ABNORMAL HIGH (ref 65–99)
Glucose-Capillary: 252 mg/dL — ABNORMAL HIGH (ref 65–99)

## 2015-07-10 LAB — MAGNESIUM: MAGNESIUM: 2.1 mg/dL (ref 1.7–2.4)

## 2015-07-10 LAB — ABO/RH: ABO/RH(D): B POS

## 2015-07-10 MED ORDER — GABAPENTIN 400 MG PO CAPS
800.0000 mg | ORAL_CAPSULE | Freq: Four times a day (QID) | ORAL | Status: DC
  Administered 2015-07-10 – 2015-07-11 (×3): 800 mg via ORAL
  Filled 2015-07-10 (×4): qty 2

## 2015-07-10 MED ORDER — SERTRALINE HCL 100 MG PO TABS
200.0000 mg | ORAL_TABLET | Freq: Every day | ORAL | Status: DC
  Administered 2015-07-11: 200 mg via ORAL
  Filled 2015-07-10: qty 2

## 2015-07-10 MED ORDER — PROCHLORPERAZINE MALEATE 5 MG PO TABS
5.0000 mg | ORAL_TABLET | Freq: Four times a day (QID) | ORAL | Status: DC | PRN
  Administered 2015-07-11: 5 mg via ORAL
  Filled 2015-07-10 (×2): qty 1

## 2015-07-10 MED ORDER — QUETIAPINE FUMARATE 400 MG PO TABS
800.0000 mg | ORAL_TABLET | Freq: Every day | ORAL | Status: DC
  Administered 2015-07-11: 800 mg via ORAL
  Filled 2015-07-10 (×2): qty 2

## 2015-07-10 MED ORDER — HYDROCHLOROTHIAZIDE 25 MG PO TABS
25.0000 mg | ORAL_TABLET | Freq: Every day | ORAL | Status: DC
  Filled 2015-07-10: qty 1

## 2015-07-10 MED ORDER — LEVETIRACETAM 750 MG PO TABS
750.0000 mg | ORAL_TABLET | Freq: Two times a day (BID) | ORAL | Status: DC
  Administered 2015-07-10 – 2015-07-11 (×2): 750 mg via ORAL
  Filled 2015-07-10 (×2): qty 1

## 2015-07-10 MED ORDER — ATORVASTATIN CALCIUM 20 MG PO TABS
80.0000 mg | ORAL_TABLET | Freq: Every day | ORAL | Status: DC
  Filled 2015-07-10: qty 4

## 2015-07-10 MED ORDER — TRAMADOL HCL 50 MG PO TABS
50.0000 mg | ORAL_TABLET | Freq: Four times a day (QID) | ORAL | Status: DC | PRN
  Administered 2015-07-10 – 2015-07-11 (×2): 50 mg via ORAL
  Filled 2015-07-10 (×3): qty 1

## 2015-07-10 MED ORDER — TIZANIDINE HCL 4 MG PO TABS
4.0000 mg | ORAL_TABLET | Freq: Four times a day (QID) | ORAL | Status: DC | PRN
  Administered 2015-07-11: 09:00:00 4 mg via ORAL
  Filled 2015-07-10: qty 1

## 2015-07-10 MED ORDER — PANTOPRAZOLE SODIUM 40 MG PO TBEC
40.0000 mg | DELAYED_RELEASE_TABLET | Freq: Every day | ORAL | Status: DC
  Administered 2015-07-11: 40 mg via ORAL
  Filled 2015-07-10: qty 1

## 2015-07-10 MED ORDER — INSULIN ASPART 100 UNIT/ML ~~LOC~~ SOLN
0.0000 [IU] | Freq: Three times a day (TID) | SUBCUTANEOUS | Status: DC
  Administered 2015-07-11 (×2): 2 [IU] via SUBCUTANEOUS
  Filled 2015-07-10 (×2): qty 2

## 2015-07-10 MED ORDER — INSULIN DETEMIR 100 UNIT/ML ~~LOC~~ SOLN
60.0000 [IU] | SUBCUTANEOUS | Status: DC
  Administered 2015-07-11: 60 [IU] via SUBCUTANEOUS
  Filled 2015-07-10 (×2): qty 0.6

## 2015-07-10 MED ORDER — LINAGLIPTIN 5 MG PO TABS
5.0000 mg | ORAL_TABLET | Freq: Every day | ORAL | Status: DC
  Administered 2015-07-11: 5 mg via ORAL
  Filled 2015-07-10: qty 1

## 2015-07-10 MED ORDER — LISINOPRIL 5 MG PO TABS
5.0000 mg | ORAL_TABLET | Freq: Every day | ORAL | Status: DC
  Filled 2015-07-10: qty 1

## 2015-07-10 NOTE — ED Notes (Signed)
Pt unable to void at this moment,given cup for when is able to urinate.

## 2015-07-10 NOTE — ED Notes (Signed)
Pt to triage via wheelchair.  Pt had labwork done today ane was called and told to come to er for eval of low hgb.  Pt alert.  Pt denies any pain.  No dizziness.

## 2015-07-10 NOTE — H&P (Signed)
[facility] NAME: [name]    MR#:  [id number]  DATE OF BIRTH:  1965-07-12  DATE OF ADMISSION:  07/10/2015  PRIMARY CARE PHYSICIAN: Enid Derry, MD   REQUESTING/REFERRING PHYSICIAN: Quale  CHIEF COMPLAINT:   Chief Complaint  Patient presents with  . Abnormal Lab  . Anemia    HISTORY OF PRESENT ILLNESS: [name]  is a 50 y.o. female with a known history of diabetes, hypercholesterolemia, bipolar disorder, seizures, neuropathy- went to her primary care doctor for regular checkup as there was a new primary care took over from her old primary care doctor's office. On the blood work she was found to have a hemoglobin of 7 while in the past her hemoglobin was 12 so she was advised to go to emergency room right away for further workup. On further questioning she denies noticing any blood or dark color stool, denies any vomiting blood . She had hysterectomy done in the past for heavy menstrual bleed 3 years ago, after that she denies any bleeding or spotting. She does not take over-the-counter pain medications or NSAIDs. ER physician ordered for blood transfusion.  PAST MEDICAL HISTORY:   Past Medical History  Diagnosis Date  . Diabetes mellitus without complication ([place])   . Hypercholesteremia   . Bipolar 1 disorder ([place])   . Arthritis   . Depression   . Blood transfusion without reported diagnosis   . Chicken pox   . History of stomach ulcers   . History of fainting spells of unknown cause   . Diabetes mellitus, type 2 ([place]) 11/12/2010  . 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 Alease Frame   . Disorder of peripheral nervous system ([place]) 11/12/2010  . Fibroid 11/12/2010    Overview:  Uterine fibroids with menorrhagia.  Fe def anemia.  GYN: Dr. Robina Ade.  Robotically-assisted total laparoscopic hysterectomy, aspiration of left ovarian cyst, cystoscopy. 08/11/09@[facility] .   . Back pain 09/08/2014   . Apnea, sleep 11/12/2010    Overview:  Overview:  A.  Sleep apnea.  Stated prev was diagnosed with sleep apnea in [place], [place] but moved to [place] and did not follow through with additional testing. ++++++++++++++++++++++++++++++++++++++ November 13, 2011 NPSG @ [facility]  overall AHI 2/hr; O2 nadir 83%. No supine sleep captured; side AHI: 1.9/hr; REM AHI 17.4/hr. ESS: 9/24; BMI 53.9 kg/m2; Wei  . Seizures ([place])     last seizure was August 2016    PAST SURGICAL HISTORY: Past Surgical History  Procedure Laterality Date  . Gastric bypass    . Partial hysterectomy    . Knee surgery    . Cholecystectomy    . Abdominal hysterectomy  08/11/2010    Partial    SOCIAL HISTORY:  Social History  Substance Use Topics  . Smoking status: Never Smoker   . Smokeless tobacco: Never Used  . Alcohol Use: No    FAMILY HISTORY:  Family History  Problem Relation Age of Onset  . Stroke Mother   . Diabetes Mother   . Stroke Father   . Hypertension Father     DRUG ALLERGIES:  Allergies  Allergen Reactions  . Penicillins Anaphylaxis and Other (See Comments)    Has patient had a PCN reaction causing immediate rash, facial/tongue/throat swelling, SOB or lightheadedness with hypotension: Yes Has patient had a PCN reaction causing severe rash involving mucus membranes or skin necrosis: No Has patient had  a PCN reaction that required hospitalization No Has patient had a PCN reaction occurring within the last 10 years: No If all of the above answers are "NO", then may proceed with Cephalosporin use.  Recardo Evangelist [Pregabalin] Other (See Comments)    Reaction:  Seizures   . Morphine And Related Itching  . Reglan [Metoclopramide] Nausea And Vomiting    Reaction:  Dizziness   . Tylenol [Acetaminophen] Other (See Comments)    Reaction:  Seizures     REVIEW OF SYSTEMS:   CONSTITUTIONAL: No fever, positive for fatigue or weakness.  EYES: No blurred or double vision.   EARS, NOSE, AND THROAT: No tinnitus or ear pain.  RESPIRATORY: No cough, shortness of breath, wheezing or hemoptysis.  CARDIOVASCULAR: No chest pain, orthopnea, edema.  GASTROINTESTINAL: No nausea, vomiting, diarrhea or abdominal pain.  GENITOURINARY: No dysuria, hematuria.  ENDOCRINE: No polyuria, nocturia,  HEMATOLOGY: No anemia, easy bruising or bleeding SKIN: No rash or lesion. MUSCULOSKELETAL: No joint pain or arthritis.   NEUROLOGIC: No tingling, numbness, weakness.  PSYCHIATRY: No anxiety or depression.   MEDICATIONS AT HOME:  Prior to Admission medications   Medication Sig Start Date End Date Taking? Authorizing Provider  atorvastatin (LIPITOR) 80 MG tablet Take 80 mg by mouth at bedtime.    Yes Historical Provider, MD  gabapentin (NEURONTIN) 800 MG tablet Take 1 tablet (800 mg total) by mouth every 6 (six) hours. 02/17/15  Yes Milinda Pointer, MD  Insulin Detemir (LEVEMIR FLEXTOUCH) 100 UNIT/ML Pen Inject 60 Units into the skin at bedtime.   Yes Historical Provider, MD  lamoTRIgine (LAMICTAL) 100 MG tablet Take 100 mg by mouth 2 (two) times daily.    Yes Historical Provider, MD  levETIRAcetam (KEPPRA) 750 MG tablet Take 750 mg by mouth 2 (two) times daily.   Yes Historical Provider, MD  metoprolol succinate (TOPROL-XL) 25 MG 24 hr tablet Take 25 mg by mouth at bedtime.    Yes Historical Provider, MD  omeprazole (PRILOSEC) 20 MG capsule Take 20 mg by mouth 2 (two) times daily.   Yes Historical Provider, MD  ondansetron (ZOFRAN) 8 MG tablet Take 8 mg by mouth every 8 (eight) hours as needed for nausea or vomiting.   Yes Historical Provider, MD  QUEtiapine (SEROQUEL) 400 MG tablet Take 800 mg by mouth at bedtime.   Yes Historical Provider, MD  sertraline (ZOLOFT) 100 MG tablet Take 100 mg by mouth 2 (two) times daily.    Yes Historical Provider, MD  hydrochlorothiazide (HYDRODIURIL) 25 MG tablet Take 25 mg by mouth daily.     Historical Provider, MD  insulin aspart (NOVOLOG  FLEXPEN) 100 UNIT/ML FlexPen INJECT UNDER THE SKIN AS DIRECTED TID AC: IF SUGAR 200-250: 10 UNITS, 251-300: 15 UNITS, 301-350: 20 UNITS, 351-400: 25 UNITS 06/24/15   Arnetha Courser, MD  iron polysaccharides (NU-IRON) 150 MG capsule Take 1 capsule (150 mg total) by mouth daily. 07/08/15   Arnetha Courser, MD  meloxicam (MOBIC) 15 MG tablet  06/03/15   Historical Provider, MD  promethazine (PHENERGAN) 25 MG tablet TAKE 1 TABLET BY MOUTH EVERY 8 HOURS AS NEEDED FOR NAUSEA 04/21/15   Bobetta Lime, MD  tiZANidine (ZANAFLEX) 4 MG tablet Take 1 tablet (4 mg total) by mouth every 6 (six) hours as needed for muscle spasms. 12/31/14   Bobetta Lime, MD  TRADJENTA 5 MG TABS tablet Take 1 tablet (5 mg total) by mouth daily. 06/18/15   Arnetha Courser, MD  traMADol (ULTRAM) 50 MG tablet  Take 1 tablet (50 mg total) by mouth every 6 (six) hours as needed for severe pain. 06/26/15   Arnetha Courser, MD      PHYSICAL EXAMINATION:   VITAL SIGNS: Blood pressure 100/66, pulse 100, temperature 98.5 F (36.9 C), temperature source Oral, resp. rate 18, height 5' (1.524 m), weight 95.255 kg (210 lb), SpO2 96 %.  GENERAL:  50 y.o.-year-old patient lying in the bed with no acute distress.  EYES: Pupils equal, round, reactive to light and accommodation. No scleral icterus. Extraocular muscles intact. Conjunctiva pale. HEENT: Head atraumatic, normocephalic. Oropharynx and nasopharynx clear.  NECK:  Supple, no jugular venous distention. No thyroid enlargement, no tenderness.  LUNGS: Normal breath sounds bilaterally, no wheezing, rales,rhonchi or crepitation. No use of accessory muscles of respiration.  CARDIOVASCULAR: S1, S2 normal. No murmurs, rubs, or gallops.  ABDOMEN: Soft, nontender, nondistended. Bowel sounds present. No organomegaly or mass.  EXTREMITIES: No pedal edema, cyanosis, or clubbing.  NEUROLOGIC: Cranial nerves II through XII are intact. Muscle strength 5/5 in all extremities. Sensation intact. Gait not  checked.  PSYCHIATRIC: The patient is alert and oriented x 3.  SKIN: No obvious rash, lesion, or ulcer.   LABORATORY PANEL:   CBC  Recent Labs Lab 07/10/15 0947 07/10/15 1530  WBC 8.1 7.4  HGB  --  6.9*  HCT 23.6* 22.8*  PLT 453* 454*  MCV 64* 63.6*  MCH 19.0* 19.3*  MCHC 29.7* 30.4*  RDW 18.0* 18.9*  LYMPHSABS 3.0  --   EOSABS 0.1  --   BASOSABS 0.0  --    ------------------------------------------------------------------------------------------------------------------  Chemistries   Recent Labs Lab 07/06/15 0806 07/10/15 1530  NA 137 136  K 4.3 3.9  CL 93* 101  CO2 24 24  GLUCOSE 153* 108*  BUN 13 15  CREATININE 1.12* 1.33*  CALCIUM 9.4 9.0  AST 29  --   ALT 18  --   ALKPHOS 135*  --   BILITOT 0.2  --    ------------------------------------------------------------------------------------------------------------------ estimated creatinine clearance is 54 mL/min (by C-G formula based on Cr of 1.33). ------------------------------------------------------------------------------------------------------------------ No results for input(s): TSH, T4TOTAL, T3FREE, THYROIDAB in the last 72 hours.  Invalid input(s): FREET3   Coagulation profile No results for input(s): INR, PROTIME in the last 168 hours. ------------------------------------------------------------------------------------------------------------------- No results for input(s): DDIMER in the last 72 hours. -------------------------------------------------------------------------------------------------------------------  Cardiac Enzymes No results for input(s): CKMB, TROPONINI, MYOGLOBIN in the last 168 hours.  Invalid input(s): CK ------------------------------------------------------------------------------------------------------------------ Invalid input(s):  POCBNP  ---------------------------------------------------------------------------------------------------------------  Urinalysis    Component Value Date/Time   COLORURINE YELLOW* 08/26/2014 2119   COLORURINE Straw 04/21/2014 0626   APPEARANCEUR CLEAR* 08/26/2014 2119   APPEARANCEUR Clear 04/21/2014 0626   LABSPEC 1.017 08/26/2014 2119   LABSPEC 1.008 04/21/2014 0626   PHURINE 6.0 08/26/2014 2119   PHURINE 6.0 04/21/2014 0626   GLUCOSEU 150* 08/26/2014 2119   GLUCOSEU Negative 04/21/2014 0626   HGBUR NEGATIVE 08/26/2014 2119   HGBUR Negative 04/21/2014 0626   BILIRUBINUR NEGATIVE 08/26/2014 2119   BILIRUBINUR Negative 04/21/2014 0626   KETONESUR NEGATIVE 08/26/2014 2119   KETONESUR Negative 04/21/2014 0626   PROTEINUR NEGATIVE 08/26/2014 2119   PROTEINUR Negative 04/21/2014 0626   NITRITE NEGATIVE 08/26/2014 2119   NITRITE Negative 04/21/2014 0626   LEUKOCYTESUR NEGATIVE 08/26/2014 2119   LEUKOCYTESUR Negative 04/21/2014 0626     RADIOLOGY: No results found.  EKG: Orders placed or performed during the hospital encounter of 07/10/15  . EKG 12-Lead  . EKG 12-Lead    IMPRESSION  AND PLAN:  * Severe anemia deficiency anemia   ER physician ordered for transfusion, she has iron workup done recently which showed iron deficiency.   I will call hematology consult for further workup.   Check for stool guaiac.   She had colonoscopy done recently which was negative- as per her.  * Diabetes   Continue her baseline Levemir, and keep on insulin sliding scale coverage.  * Hypertension   Continue home medications.  * Neuropathy  Continue pending.  * Seizures   Continue Keppra.   All the records are reviewed and case discussed with ED provider. Management plans discussed with the patient, family and they are in agreement.  CODE STATUS: Full. Code Status History    This patient does not have a recorded code status. Please follow your organizational policy for patients  in this situation.       TOTAL TIME TAKING CARE OF THIS PATIENT: 50 minutes.    Vaughan Basta M.D on 07/10/2015   Between 7am to 6pm - Pager - [phone]  After 6pm go to [email or web address] - password [email or web address] [facility]  Office  [phone]  CC: Primary care physician; Enid Derry, MD   Note: This dictation was prepared with Dragon dictation along with smaller phrase technology. Any transcriptional errors that result from this process are unintentional.

## 2015-07-10 NOTE — Telephone Encounter (Signed)
Pt notified and intstuctedto go to the ER

## 2015-07-10 NOTE — ED Provider Notes (Addendum)
[facility] Emergency Department Provider Note  ____________________________________________  Time seen: Approximately 6:50 PM  I have reviewed the triage vital signs and the nursing notes.   HISTORY  Chief Complaint Abnormal Lab and Anemia    HPI [name] is a 50 y.o. female presents for evaluation of low blood count.  The patient reports she has been seeing her doctor is been tracking her blood counts. She is not aware of any bleeding. She was just given a prescription for iron tablets.  She is not having any pain, shortness of breath or weakness. She has had a couple episodes the last couple months for when she stands up quickly she passes out, this last occurred about a month ago without injury on the bed.  She denies any black or bloody stool. No diarrhea. No abdominal pain. No vomiting.  She's had a hysterectomy and does not have any vaginal bleeding.  No pain.  Mild fatigue for the last several weeks.  Past Medical History  Diagnosis Date  . Diabetes mellitus without complication ([place])   . Hypercholesteremia   . Bipolar 1 disorder ([place])   . Arthritis   . Depression   . Blood transfusion without reported diagnosis   . Chicken pox   . History of stomach ulcers   . History of fainting spells of unknown cause   . Diabetes mellitus, type 2 ([place]) 11/12/2010  . 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 Alease Frame   . Disorder of peripheral nervous system ([place]) 11/12/2010  . Fibroid 11/12/2010    Overview:  Uterine fibroids with menorrhagia.  Fe def anemia.  GYN: Dr. Robina Ade.  Robotically-assisted total laparoscopic hysterectomy, aspiration of left ovarian cyst, cystoscopy. 08/11/09@[facility] .   . Back pain 09/08/2014  . Apnea, sleep 11/12/2010    Overview:  Overview:  A.  Sleep apnea.  Stated prev was diagnosed with sleep apnea in [place], [place] but moved to [place] and did not follow through with  additional testing. ++++++++++++++++++++++++++++++++++++++ November 13, 2011 NPSG @ [facility]  overall AHI 2/hr; O2 nadir 83%. No supine sleep captured; side AHI: 1.9/hr; REM AHI 17.4/hr. ESS: 9/24; BMI 53.9 kg/m2; Wei  . Seizures [facility])     last seizure was August 2016    Patient Active Problem List   Diagnosis Date Noted  . Anemia 06/26/2015  . Hyperlipidemia 06/26/2015  . Breast hypertrophy 06/26/2015  . Vaginal mass 06/26/2015  . Breast lump on right side at 9 o'clock position 06/26/2015  . Breast lump on left side at 2 o'clock position 06/26/2015  . Medication monitoring encounter 06/26/2015  . Encounter for screening for cervical cancer  06/26/2015  . Preventative health care 06/26/2015  . Screening for STD (sexually transmitted disease) 04/13/2015  . Chronic pain 02/17/2015  . Long term current use of opiate analgesic 02/17/2015  . Long term prescription opiate use 02/17/2015  . Opiate use 02/17/2015  . Opiate dependence ([place]) 02/17/2015  . Encounter for therapeutic drug level monitoring 02/17/2015  . Diabetic peripheral neuropathy ([place]) 02/17/2015  . Neurogenic pain 02/17/2015  . Musculoskeletal pain 02/17/2015  . Chronic lower extremity pain (Referred Pain) (Bilateral) (R>L) 02/17/2015  . Lumbar spondylosis 02/17/2015  . Osteoarthrosis 02/17/2015  . Lumbar facet syndrome (Bilateral) (R>L) 02/17/2015  . Chronic knee pain (Left) 02/17/2015  . 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 Tricompartmental disease of knee (Left) 02/17/2015  . Large breasts 02/10/2015  . Morbid obesity (  [place]) 02/02/2015  . Thrombocytosis ([place]) 02/02/2015  . Iron deficiency anemia, unspecified 01/01/2015  . Insomnia 12/31/2014  . Non compliance with medical treatment 12/07/2014  . Chronic low back pain (LBP>LEP) (Bilateral) (R>L) 10/04/2014  . Lumbar DDD 09/08/2014  . Bipolar disorder ([employer]) 09/08/2014  . Bipolar affective disorder ([place]) 04/08/2013  . History of  bariatric surgery (2014) 05/02/2012  . Avitaminosis D 12/14/2011  . Polypharmacy 11/16/2011  . 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 Cyst of pancreas 11/12/2010  . Disorder of peripheral nervous system ([place]) 11/12/2010  . Apnea, sleep 11/12/2010  . Uncontrolled type 2 diabetes mellitus with diabetic neuropathy, with long-term current use of insulin ([place]) 11/12/2010    Past Surgical History  Procedure Laterality Date  . Gastric bypass    . Partial hysterectomy    . Knee surgery    . Cholecystectomy    . Abdominal hysterectomy  08/11/2010    Partial    Current Outpatient Rx  Name  Route  Sig  Dispense  Refill  . atorvastatin (LIPITOR) 80 MG tablet   Oral   Take 80 mg by mouth at bedtime.       7   . gabapentin (NEURONTIN) 800 MG tablet   Oral   Take 1 tablet (800 mg total) by mouth every 6 (six) hours.   120 tablet   0   . hydrochlorothiazide (HYDRODIURIL) 25 MG tablet   Oral   Take 25 mg by mouth 2 (two) times daily.       1   . insulin aspart (NOVOLOG FLEXPEN) 100 UNIT/ML FlexPen   Subcutaneous   Inject 10-25 Units into the skin 3 (three) times daily with meals as needed for high blood sugar. Pt uses as needed per sliding scale:    200-250:  10 units  251-300:  15 units  301-350:  20 units  351-400:  25 units         . Insulin Detemir (LEVEMIR FLEXTOUCH) 100 UNIT/ML Pen   Subcutaneous   Inject 60 Units into the skin at bedtime.         . lamoTRIgine (LAMICTAL) 100 MG tablet   Oral   Take 100 mg by mouth 2 (two) times daily.          Marland Kitchen levETIRAcetam (KEPPRA) 750 MG tablet   Oral   Take 750 mg by mouth 2 (two) times daily.         Marland Kitchen linagliptin (TRADJENTA) 5 MG TABS tablet   Oral   Take 5 mg by mouth at bedtime.         . metoprolol succinate (TOPROL-XL) 25 MG 24 hr tablet   Oral   Take 25 mg by mouth at bedtime.          Marland Kitchen omeprazole (PRILOSEC) 20 MG capsule   Oral   Take 20 mg by mouth  2 (two) times daily.         . ondansetron (ZOFRAN) 8 MG tablet   Oral   Take 8 mg by mouth every 8 (eight) hours as needed for nausea or vomiting.         . promethazine (PHENERGAN) 25 MG tablet   Oral   Take 25 mg by mouth every 8 (eight) hours as needed for nausea or vomiting.         Marland Kitchen QUEtiapine (SEROQUEL) 400 MG tablet   Oral   Take 800 mg by mouth at bedtime.         Marland Kitchen  sertraline (ZOLOFT) 100 MG tablet   Oral   Take 100 mg by mouth 2 (two) times daily.          Marland Kitchen tiZANidine (ZANAFLEX) 4 MG tablet   Oral   Take 1 tablet (4 mg total) by mouth every 6 (six) hours as needed for muscle spasms.   360 tablet   3     **Patient requests 90 days supply**   . traMADol (ULTRAM) 50 MG tablet   Oral   Take 100 mg by mouth every 6 (six) hours as needed for moderate pain.         . iron polysaccharides (NU-IRON) 150 MG capsule   Oral   Take 1 capsule (150 mg total) by mouth daily.   30 capsule   1     Allergies Penicillins; Lyrica; Morphine and related; Reglan; and Tylenol  Family History  Problem Relation Age of Onset  . Stroke Mother   . Diabetes Mother   . Stroke Father   . Hypertension Father     Social History Social History  Substance Use Topics  . Smoking status: Never Smoker   . Smokeless tobacco: Never Used  . Alcohol Use: No    Review of Systems Constitutional: No fever/chills Eyes: No visual changes. ENT: No sore throat. Cardiovascular: Denies chest pain. Respiratory: Denies shortness of breath. Gastrointestinal: No abdominal pain.  No nausea, no vomiting.  No diarrhea.  No constipation. Genitourinary: Negative for dysuria. Musculoskeletal: Negative for back pain. Skin: Negative for rash. Neurological: Negative for headaches, focal weakness or numbness.  10-point ROS otherwise negative.  ____________________________________________   PHYSICAL EXAM:  VITAL SIGNS: ED Triage Vitals  Enc Vitals Group     BP 07/10/15 1522 100/66  mmHg     Pulse Rate 07/10/15 1522 100     Resp 07/10/15 1522 18     Temp 07/10/15 1522 98.5 F (36.9 C)     Temp Source 07/10/15 1522 Oral     SpO2 07/10/15 1522 96 %     Weight 07/10/15 1522 210 lb (95.255 kg)     Height 07/10/15 1522 5' (1.524 m)     Head Cir --      Peak Flow --      Pain Score --      Pain Loc --      Pain Edu? --      Excl. in [place]? --    Constitutional: Alert and oriented. Well appearing and in no acute distress. Eyes: Conjunctivae are Slightly pale. PERRL. EOMI. Head: Atraumatic. Nose: No congestion/rhinnorhea. Mouth/Throat: Mucous membranes are moist.  Oropharynx non-erythematous. Neck: No stridor.   Cardiovascular: Normal rate, regular rhythm. Grossly normal heart sounds.  Good peripheral circulation. Respiratory: Normal respiratory effort.  No retractions. Lungs CTAB. Gastrointestinal: Soft and nontender. No distention. Musculoskeletal: No lower extremity tenderness nor edema.  No joint effusions. Neurologic:  Normal speech and language. No gross focal neurologic deficits are appreciated. Skin:  Skin is warm, dry and intact. No rash noted. Psychiatric: Mood and affect are normal. Speech and behavior are normal.  ____________________________________________   LABS (all labs ordered are listed, but only abnormal results are displayed)  Labs Reviewed  CBC - Abnormal; Notable for the following:    RBC 3.59 (*)    Hemoglobin 6.9 (*)    HCT 22.8 (*)    MCV 63.6 (*)    MCH 19.3 (*)    MCHC 30.4 (*)    RDW 18.9 (*)    Platelets  454 (*)    All other components within normal limits  BASIC METABOLIC PANEL - Abnormal; Notable for the following:    Glucose, Bld 108 (*)    Creatinine, Ser 1.33 (*)    GFR calc non Af Amer 47 (*)    GFR calc Af Amer 54 (*)    All other components within normal limits  URINALYSIS COMPLETEWITH MICROSCOPIC ([facility] ONLY)  OCCULT BLOOD X 1 CARD TO LAB, STOOL  MAGNESIUM  TYPE AND SCREEN    ____________________________________________  EKG  Reviewed injury by me at [place] rate 95 QRS 80 QTc 450 Normal sinus rhythm, mild prolongation of the QT interval is normal. No ischemic change  We'll add on magnesium level. ____________________________________________  RADIOLOGY   ____________________________________________   PROCEDURES  Procedure(s) performed: None  Critical Care performed: No  ____________________________________________   INITIAL IMPRESSION / ASSESSMENT AND PLAN / ED COURSE  Pertinent labs & imaging results that were available during my care of the patient were reviewed by me and considered in my medical decision making (see chart for details).  Patient with anemia, hemoglobin less than 7. Those reports she's had syncope with standing and some questionable orthostatic symptoms. She is alert in no distress. Vital signs stable though she has mildly arguable low blood pressure and slightly elevated heart rate. Given her hemoglobin less than 7, we'll admit her to the hospital for further workup and the possible need for blood transfusion. ____________________________________________   FINAL CLINICAL IMPRESSION(S) / ED DIAGNOSES  Final diagnoses:  Anemia, unspecified anemia type  Prolonged Q-T interval on ECG      Delman Kitten, MD 07/10/15 2003  Delman Kitten, MD 07/10/15 2004

## 2015-07-10 NOTE — Telephone Encounter (Signed)
That was prescribed by Dr. Dossie Arbour per our med list Please get fill history for this drug from [facility]

## 2015-07-10 NOTE — Telephone Encounter (Signed)
Wants to see if she can get this?

## 2015-07-10 NOTE — Telephone Encounter (Signed)
Please call patient; she has a very low hemoglobin; it is only 7; it has dropped from 9; normal is 12; we sometimes need to give blood if the number is under 8 Please instruct her to go the ER for a stat recheck, evaluation, and to see if she needs blood

## 2015-07-10 NOTE — ED Notes (Signed)
Unsuccessful IV start in right arm x2.

## 2015-07-11 DIAGNOSIS — R0609 Other forms of dyspnea: Secondary | ICD-10-CM

## 2015-07-11 DIAGNOSIS — Z9884 Bariatric surgery status: Secondary | ICD-10-CM

## 2015-07-11 DIAGNOSIS — G4730 Sleep apnea, unspecified: Secondary | ICD-10-CM

## 2015-07-11 DIAGNOSIS — E7800 Pure hypercholesterolemia, unspecified: Secondary | ICD-10-CM

## 2015-07-11 DIAGNOSIS — R5383 Other fatigue: Secondary | ICD-10-CM

## 2015-07-11 DIAGNOSIS — F319 Bipolar disorder, unspecified: Secondary | ICD-10-CM

## 2015-07-11 DIAGNOSIS — E119 Type 2 diabetes mellitus without complications: Secondary | ICD-10-CM

## 2015-07-11 DIAGNOSIS — D509 Iron deficiency anemia, unspecified: Principal | ICD-10-CM

## 2015-07-11 DIAGNOSIS — F5089 Other specified eating disorder: Secondary | ICD-10-CM

## 2015-07-11 DIAGNOSIS — Z79899 Other long term (current) drug therapy: Secondary | ICD-10-CM

## 2015-07-11 DIAGNOSIS — Z90710 Acquired absence of both cervix and uterus: Secondary | ICD-10-CM

## 2015-07-11 LAB — URINALYSIS COMPLETE WITH MICROSCOPIC (ARMC ONLY)
BILIRUBIN URINE: NEGATIVE
Bacteria, UA: NONE SEEN
GLUCOSE, UA: NEGATIVE mg/dL
Hgb urine dipstick: NEGATIVE
KETONES UR: NEGATIVE mg/dL
LEUKOCYTES UA: NEGATIVE
NITRITE: NEGATIVE
Protein, ur: NEGATIVE mg/dL
SPECIFIC GRAVITY, URINE: 1.017 (ref 1.005–1.030)
pH: 6 (ref 5.0–8.0)

## 2015-07-11 LAB — CBC
HCT: 28.6 % — ABNORMAL LOW (ref 35.0–47.0)
Hemoglobin: 9.1 g/dL — ABNORMAL LOW (ref 12.0–16.0)
MCH: 22 pg — AB (ref 26.0–34.0)
MCHC: 32 g/dL (ref 32.0–36.0)
MCV: 68.7 fL — AB (ref 80.0–100.0)
PLATELETS: 427 10*3/uL (ref 150–440)
RBC: 4.16 MIL/uL (ref 3.80–5.20)
RDW: 25.1 % — AB (ref 11.5–14.5)
WBC: 7.4 10*3/uL (ref 3.6–11.0)

## 2015-07-11 LAB — BASIC METABOLIC PANEL
Anion gap: 7 (ref 5–15)
BUN: 15 mg/dL (ref 6–20)
CALCIUM: 9.1 mg/dL (ref 8.9–10.3)
CHLORIDE: 105 mmol/L (ref 101–111)
CO2: 25 mmol/L (ref 22–32)
CREATININE: 1 mg/dL (ref 0.44–1.00)
GFR calc Af Amer: >60 mL/min (ref >60)
GFR calc non Af Amer: >60 mL/min (ref >60)
Glucose, Bld: 148 mg/dL — ABNORMAL HIGH (ref 65–99)
Potassium: 3.8 mmol/L (ref 3.5–5.1)
SODIUM: 137 mmol/L (ref 135–145)

## 2015-07-11 LAB — GLUCOSE, CAPILLARY
GLUCOSE-CAPILLARY: 173 mg/dL — AB (ref 65–99)
GLUCOSE-CAPILLARY: 107 mg/dL — AB (ref 65–99)
Glucose-Capillary: 193 mg/dL — ABNORMAL HIGH (ref 65–99)

## 2015-07-11 MED ORDER — SODIUM CHLORIDE 0.9 % IV SOLN
Freq: Once | INTRAVENOUS | Status: AC
  Administered 2015-07-11: 04:00:00 via INTRAVENOUS

## 2015-07-11 MED ORDER — ONDANSETRON HCL 4 MG PO TABS
4.0000 mg | ORAL_TABLET | Freq: Once | ORAL | Status: AC
  Administered 2015-07-11: 15:00:00 4 mg via ORAL
  Filled 2015-07-11: qty 1

## 2015-07-11 MED ORDER — SODIUM CHLORIDE 0.9 % IV SOLN
Freq: Once | INTRAVENOUS | Status: AC
  Administered 2015-07-11: 11:00:00 via INTRAVENOUS

## 2015-07-11 NOTE — Progress Notes (Signed)
Dr. Bridgett Larsson notified of HGB of 9.1 and creatinine of 1.00 ok to discharge.  Donnita Falls, RN  07/11/2015 5:14 PM

## 2015-07-11 NOTE — Progress Notes (Signed)
Pt is being discharged home. Discharge instructions given and explained to pt. Pt verbalized understanding. Meds and follow up appointments reviewed with pt. No RX at this time.

## 2015-07-11 NOTE — Discharge Instructions (Signed)
Heart healthy and ADA diet. °Activity as tolerated. °

## 2015-07-11 NOTE — Consult Note (Signed)
[facility] CONSULT NOTE  Patient Care Team: Arnetha Courser, MD as PCP - General (Family Medicine)  CHIEF COMPLAINTS/PURPOSE OF CONSULTATION:  Severe anemia.  HISTORY OF PRESENTING ILLNESS:  [name] 50 y.o.  female noted to have severe anemia hemoglobin 7/ which was noted on incident labs. She was then referred to the emergency room for further evaluation/platelet transfusion. Status post 2 units of blood transfusion patient's hemoglobin is 9.  Patient denies any history of blood in stools black stools. Patient has a history of anemia, initially thought to be from her history of menorrhagia- she underwent hysterectomy in May 2011. Patient also underwent gastric bypass in February 2014; and since then she has lost considerable amount of weight.   Patient does not take any by mouth iron. She never received any IV iron; although she was told that she might need IV iron. She is not on B12. She complains of pica. Also complains of significant fatigue/shortness of breath with exertion. Prior history of colonoscopy.  ROS: A complete 10 point review of system is done which is negative except mentioned above in history of present illness  MEDICAL HISTORY:  Past Medical History  Diagnosis Date  . Diabetes mellitus without complication ([place])   . Hypercholesteremia   . Bipolar 1 disorder ([place])   . Arthritis   . Depression   . Blood transfusion without reported diagnosis   . Chicken pox   . History of stomach ulcers   . History of fainting spells of unknown cause   . Diabetes mellitus, type 2 ([place]) 11/12/2010  . 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 Alease Frame   . Disorder of peripheral nervous system ([place]) 11/12/2010  . Fibroid 11/12/2010    Overview:  Uterine fibroids with menorrhagia.  Fe def anemia.  GYN: Dr. Robina Ade.  Robotically-assisted total laparoscopic hysterectomy, aspiration of left ovarian cyst, cystoscopy. 08/11/09@[facility] .    . Back pain 09/08/2014  . Apnea, sleep 11/12/2010    Overview:  Overview:  A.  Sleep apnea.  Stated prev was diagnosed with sleep apnea in [place], [place] but moved to [place] and did not follow through with additional testing. ++++++++++++++++++++++++++++++++++++++ November 13, 2011 NPSG @ [facility]  overall AHI 2/hr; O2 nadir 83%. No supine sleep captured; side AHI: 1.9/hr; REM AHI 17.4/hr. ESS: 9/24; BMI 53.9 kg/m2; Wei  . Seizures ([place])     last seizure was August 2016    SURGICAL HISTORY: Past Surgical History  Procedure Laterality Date  . Gastric bypass    . Partial hysterectomy    . Knee surgery    . Cholecystectomy    . Abdominal hysterectomy  08/11/2010    Partial    SOCIAL HISTORY: Social History   Social History  . Marital Status: Married    Spouse Name: N/A  . Number of Children: N/A  . Years of Education: N/A   Occupational History  . Not on file.   Social History Main Topics  . Smoking status: Never Smoker   . Smokeless tobacco: Never Used  . Alcohol Use: No  . Drug Use: No  . Sexual Activity:    Partners: Male    Birth Control/ Protection: Surgical   Other Topics Concern  . 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 Stroke Mother   . Diabetes Mother   . Stroke Father   . Hypertension Father     ALLERGIES:  is allergic to penicillins; lyrica; morphine and related; reglan; and tylenol.  MEDICATIONS:  Current Facility-Administered Medications  Medication Dose Route Frequency Provider Last Rate Last Dose  . atorvastatin (LIPITOR) tablet 80 mg  80 mg Oral QHS Vaughan Basta, MD   80 mg at 07/10/15 2328  .  gabapentin (NEURONTIN) capsule 800 mg  800 mg Oral Q6H Vaughan Basta, MD   800 mg at 07/11/15 1312  . insulin aspart (novoLOG) injection 0-9 Units  0-9 Units Subcutaneous TID WC Vaughan Basta, MD   2 Units at 07/11/15 1707  . insulin detemir (LEVEMIR) injection 60 Units  60 Units Subcutaneous Q24H Vaughan Basta, MD   60 Units at 07/11/15 0004  . levETIRAcetam (KEPPRA) tablet 750 mg  750 mg Oral BID Vaughan Basta, MD   750 mg at 07/11/15 1112  . linagliptin (TRADJENTA) tablet 5 mg  5 mg Oral Daily Vaughan Basta, MD   5 mg at 07/11/15 0837  . pantoprazole (PROTONIX) EC tablet 40 mg  40 mg Oral Daily Vaughan Basta, MD   40 mg at 07/11/15 0837  . prochlorperazine (COMPAZINE) tablet 5 mg  5 mg Oral Q6H PRN Lance Coon, MD   5 mg at 07/11/15 0840  . QUEtiapine (SEROQUEL) tablet 800 mg  800 mg Oral QHS Vaughan Basta, MD   800 mg at 07/11/15 0003  . sertraline (ZOLOFT) tablet 200 mg  200 mg Oral Daily Vaughan Basta, MD   200 mg at 07/11/15 0837  . tiZANidine (ZANAFLEX) tablet 4 mg  4 mg Oral Q6H PRN Vaughan Basta, MD   4 mg at 07/11/15 0840  . traMADol (ULTRAM) tablet 50 mg  50 mg Oral Q6H PRN Vaughan Basta, MD   50 mg at 07/11/15 1020      .  PHYSICAL EXAMINATION:   Filed Vitals:   07/11/15 1220 07/11/15 1527  BP: 98/66 98/57  Pulse: 93 89  Temp: 98.1 F (36.7 C) 97.7 F (36.5 C)  Resp: 18 18   Filed Weights   07/10/15 1522 07/10/15 2200  Weight: 210 lb (95.255 kg) 227 lb 9.6 oz (103.239 kg)    GENERAL: Well-nourished well-developed; Alert, no distress and comfortable.   Accompanied by family.  EYES: no pallor or icterus OROPHARYNX: no thrush or ulceration; good dentition  NECK: supple, no masses felt LYMPH:  no palpable lymphadenopathy in the cervical, axillary or inguinal regions LUNGS: clear to auscultation and  No wheeze or crackles HEART/CVS: regular rate & rhythm and no murmurs; No lower extremity  edema ABDOMEN: abdomen soft, non-tender and normal bowel sounds Musculoskeletal:no cyanosis of digits and no clubbing  PSYCH: alert & oriented x 3 with fluent speech NEURO: no focal motor/sensory deficits SKIN:  no rashes or significant lesions  LABORATORY DATA:  I have reviewed the data as listed Lab Results  Component Value Date   WBC 7.4 07/11/2015   HGB 9.1* 07/11/2015   HCT 28.6* 07/11/2015   MCV 68.7* 07/11/2015   PLT 427 07/11/2015    Recent Labs  08/26/14 2124 12/31/14 1130  07/06/15 0806 07/10/15 1530 07/11/15 1637  NA 141 141  < >  137 136 137  K 3.7 5.5*  < > 4.3 3.9 3.8  CL 102 101  < > 93* 101 105  CO2 26 24  < > 24 24 25   GLUCOSE 63* 207*  < > 153* 108* 148*  BUN 13 12  < > 13 15 15   CREATININE 0.95 1.06*  < > 1.12* 1.33* 1.00  CALCIUM 9.7 9.5  < > 9.4 9.0 9.1  GFRNONAA >60 63  < > 59* 47* >60  GFRAA >60 72  < > 68 54* >60  PROT 8.9* 7.9  --  8.0  --   --   ALBUMIN 4.5 4.5  --  4.5  --   --   AST 48* 34  --  29  --   --   ALT 38 21  --  18  --   --   ALKPHOS 116 134*  --  135*  --   --   BILITOT 0.5 <0.2  --  0.2  --   --   < > = values in this interval not displayed.  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 Sat-5%.  Patient status post total PRBC transfusion hemoglobin up to 9.4.  # I recommend IV iron Feraheme; also recommend by mouth iron. Also recommend B12 sublingual/B12 shots. However patient is being discharged now.   # Patient states that she is planning to move to [facility] this week. She was asked to follow-up with hematology as soon as she relocates as she will need IV iron.   The above plan of care was discussed with the patient and family. She agrees.   Thank you Dr. Bridgett Larsson for allowing me to participate in the care of your pleasant patient. Please do not hesitate to contact me with questions or concerns in the interim.      Cammie Sickle, MD 07/11/2015 5:42 PM

## 2015-07-11 NOTE — Discharge Summary (Signed)
[facility] NAME: [name]    MR#:  [id number]  DATE OF BIRTH:  03/02/1966  DATE OF ADMISSION:  07/10/2015 ADMITTING PHYSICIAN: Vaughan Basta, MD  DATE OF DISCHARGE: 07/11/2015 PRIMARY CARE PHYSICIAN: Enid Derry, MD    ADMISSION DIAGNOSIS:  Prolonged Q-T interval on ECG [I45.81] Anemia, unspecified anemia type [D64.9]   DISCHARGE DIAGNOSIS:  Severe anemia deficiency anemia  SECONDARY DIAGNOSIS:   Past Medical History  Diagnosis Date  . Diabetes mellitus without complication ([place])   . Hypercholesteremia   . Bipolar 1 disorder ([place])   . Arthritis   . Depression   . Blood transfusion without reported diagnosis   . Chicken pox   . History of stomach ulcers   . History of fainting spells of unknown cause   . Diabetes mellitus, type 2 ([place]) 11/12/2010  . 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 Alease Frame   . Disorder of peripheral nervous system ([place]) 11/12/2010  . Fibroid 11/12/2010    Overview:  Uterine fibroids with menorrhagia.  Fe def anemia.  GYN: Dr. Robina Ade.  Robotically-assisted total laparoscopic hysterectomy, aspiration of left ovarian cyst, cystoscopy. 08/11/09@[facility] .   . Back pain 09/08/2014  . Apnea, sleep 11/12/2010    Overview:  Overview:  A.  Sleep apnea.  Stated prev was diagnosed with sleep apnea in [place], [place] but moved to [place] and did not follow through with additional testing. ++++++++++++++++++++++++++++++++++++++ November 13, 2011 NPSG @ [facility]  overall AHI 2/hr; O2 nadir 83%. No supine sleep captured; side AHI: 1.9/hr; REM AHI 17.4/hr. ESS: 9/24; BMI 53.9 kg/m2; Wei  . Seizures ([place])     last seizure was August 2016    HOSPITAL COURSE:   * Severe anemia deficiency anemia She is getting 2nd units PRBC transfusion. Repeat Hb after transfusion.follow up hematology as outpatient.  * Diabetes  Continue her  baseline Levemir, on insulin sliding scale coverage.  * Hypertension  Hold HCTZ and lopressor due to low side BP.  * Neuropathy Continue pending.  * Seizures  Continue Keppra.  * ARF, follow up BMP. Hold HCTZ.  The patient will be discharged if stable after transfusion.  DISCHARGE CONDITIONS:   Stable, discharge to home.  CONSULTS OBTAINED:  Treatment Team:  Cammie Sickle, MD  DRUG ALLERGIES:   Allergies  Allergen Reactions  . Penicillins Anaphylaxis and Other (See Comments)    Has patient had a PCN reaction causing immediate rash, facial/tongue/throat swelling, SOB or lightheadedness with hypotension: Yes Has patient had a PCN reaction causing severe rash involving mucus membranes or skin necrosis: No Has patient had a PCN reaction that required hospitalization No Has patient had a PCN reaction occurring within the last 10 years: No If all of the above answers are "NO", then may proceed with Cephalosporin use.  Recardo Evangelist [Pregabalin] Other (See Comments)    Reaction:  Seizures   . Morphine And Related Itching  . Reglan [Metoclopramide] Nausea And Vomiting    Reaction:  Dizziness   . Tylenol [Acetaminophen] Other (See Comments)    Reaction:  Seizures     DISCHARGE MEDICATIONS:   Current Discharge Medication List    CONTINUE these medications which have NOT CHANGED   Details  atorvastatin (LIPITOR) 80 MG tablet Take 80 mg by mouth at bedtime.  Refills: 7    gabapentin (NEURONTIN) 800 MG tablet Take 1 tablet (800 mg total) by  mouth every 6 (six) hours. Qty: 120 tablet, Refills: 0   Associated Diagnoses: Chronic pain    insulin aspart (NOVOLOG FLEXPEN) 100 UNIT/ML FlexPen Inject 10-25 Units into the skin 3 (three) times daily with meals as needed for high blood sugar. Pt uses as needed per sliding scale:    200-250:  10 units  251-300:  15 units  301-350:  20 units  351-400:  25 units    Insulin Detemir (LEVEMIR FLEXTOUCH) 100 UNIT/ML Pen Inject 60  Units into the skin at bedtime.    lamoTRIgine (LAMICTAL) 100 MG tablet Take 100 mg by mouth 2 (two) times daily.     levETIRAcetam (KEPPRA) 750 MG tablet Take 750 mg by mouth 2 (two) times daily.    linagliptin (TRADJENTA) 5 MG TABS tablet Take 5 mg by mouth at bedtime.    omeprazole (PRILOSEC) 20 MG capsule Take 20 mg by mouth 2 (two) times daily.    ondansetron (ZOFRAN) 8 MG tablet Take 8 mg by mouth every 8 (eight) hours as needed for nausea or vomiting.    promethazine (PHENERGAN) 25 MG tablet Take 25 mg by mouth every 8 (eight) hours as needed for nausea or vomiting.    QUEtiapine (SEROQUEL) 400 MG tablet Take 800 mg by mouth at bedtime.    sertraline (ZOLOFT) 100 MG tablet Take 100 mg by mouth 2 (two) times daily.     tiZANidine (ZANAFLEX) 4 MG tablet Take 1 tablet (4 mg total) by mouth every 6 (six) hours as needed for muscle spasms. Qty: 360 tablet, Refills: 3   Associated Diagnoses: Chronic low back pain; DDD (degenerative disc disease), lumbosacral    traMADol (ULTRAM) 50 MG tablet Take 100 mg by mouth every 6 (six) hours as needed for moderate pain.    iron polysaccharides (NU-IRON) 150 MG capsule Take 1 capsule (150 mg total) by mouth daily. Qty: 30 capsule, Refills: 1      STOP taking these medications     hydrochlorothiazide (HYDRODIURIL) 25 MG tablet      metoprolol succinate (TOPROL-XL) 25 MG 24 hr tablet          DISCHARGE INSTRUCTIONS:   If you experience worsening of your admission symptoms, develop shortness of breath, life threatening emergency, suicidal or homicidal thoughts you must seek medical attention immediately by calling 911 or calling your MD immediately  if symptoms less severe.  You Must read complete instructions/literature along with all the possible adverse reactions/side effects for all the Medicines you take and that have been prescribed to you. Take any new Medicines after you have completely understood and accept all the possible  adverse reactions/side effects.   Please note  You were cared for by a hospitalist during your hospital stay. If you have any questions about your discharge medications or the care you received while you were in the hospital after you are discharged, you can call the unit and asked to speak with the hospitalist on call if the hospitalist that took care of you is not available. Once you are discharged, your primary care physician will handle any further medical issues. Please note that NO REFILLS for any discharge medications will be authorized once you are discharged, as it is imperative that you return to your primary care physician (or establish a relationship with a primary care physician if you do not have one) for your aftercare needs so that they can reassess your need for medications and monitor your lab values.    Today  SUBJECTIVE   Back pain.   VITAL SIGNS:  Blood pressure 98/57, pulse 89, temperature 97.7 F (36.5 C), temperature source Oral, resp. rate 18, height 5' (1.524 m), weight 103.239 kg (227 lb 9.6 oz), SpO2 98 %.  I/O:   Intake/Output Summary (Last 24 hours) at 07/11/15 1615 Last data filed at 07/11/15 1527  Gross per 24 hour  Intake   1033 ml  Output      0 ml  Net   1033 ml    PHYSICAL EXAMINATION:  GENERAL:  50 y.o.-year-old patient lying in the bed with no acute distress. Morbid obese. EYES: Pupils equal, round, reactive to light and accommodation. No scleral icterus. Extraocular muscles intact.  HEENT: Head atraumatic, normocephalic. Oropharynx and nasopharynx clear.  NECK:  Supple, no jugular venous distention. No thyroid enlargement, no tenderness.  LUNGS: Normal breath sounds bilaterally, no wheezing, rales,rhonchi or crepitation. No use of accessory muscles of respiration.  CARDIOVASCULAR: S1, S2 normal. No murmurs, rubs, or gallops.  ABDOMEN: Soft, non-tender, non-distended. Bowel sounds present. No organomegaly or mass.  EXTREMITIES: No pedal  edema, cyanosis, or clubbing.  NEUROLOGIC: Cranial nerves II through XII are intact. Muscle strength 5/5 in all extremities. Sensation intact. Gait not checked.  PSYCHIATRIC: The patient is alert and oriented x 3.  SKIN: No obvious rash, lesion, or ulcer.   DATA REVIEW:   CBC  Recent Labs Lab 07/10/15 1530  WBC 7.4  HGB 6.9*  HCT 22.8*  PLT 454*    Chemistries   Recent Labs Lab 07/06/15 0806 07/10/15 1530  NA 137 136  K 4.3 3.9  CL 93* 101  CO2 24 24  GLUCOSE 153* 108*  BUN 13 15  CREATININE 1.12* 1.33*  CALCIUM 9.4 9.0  MG  --  2.1  AST 29  --   ALT 18  --   ALKPHOS 135*  --   BILITOT 0.2  --     Cardiac Enzymes No results for input(s): TROPONINI in the last 168 hours.  Microbiology Results  Results for orders placed or performed in visit on 04/21/14  Culture, blood (single)     Status: None   Collection Time: 04/21/14  5:05 AM  Result Value Ref Range Status   Micro Text Report   Final       COMMENT                   NO GROWTH AEROBICALLY/ANAEROBICALLY IN 5 DAYS   ANTIBIOTIC                                                      Culture, blood (single)     Status: None   Collection Time: 04/21/14  6:21 AM  Result Value Ref Range Status   Micro Text Report   Final       COMMENT                   NO GROWTH AEROBICALLY/ANAEROBICALLY IN 5 DAYS   ANTIBIOTIC                                                        RADIOLOGY:  No  results found.      Management plans discussed with the patient, family and they are in agreement.  CODE STATUS:     Code Status Orders        Start     Ordered   07/10/15 2230  Full code   Continuous     07/10/15 2229    Code Status History    Date Active Date Inactive Code Status Order ID Comments User Context   This patient has a current code status but no historical code status.      TOTAL TIME TAKING CARE OF THIS PATIENT: 35 minutes.    Demetrios Loll M.D on 07/11/2015 at 4:15 PM  Between 7am to 6pm -  Pager - [phone]  After 6pm go to [email or web address] - password [email or web address] [facility]  Office  [phone]  CC: Primary care physician; Enid Derry, MD

## 2015-07-13 ENCOUNTER — Other Ambulatory Visit: Payer: Self-pay | Admitting: Family Medicine

## 2015-07-13 ENCOUNTER — Encounter: Payer: Self-pay | Admitting: Family Medicine

## 2015-07-13 LAB — TYPE AND SCREEN
ABO/RH(D): B POS
Antibody Screen: NEGATIVE
UNIT DIVISION: 0
UNIT DIVISION: 0
Unit division: 0
Unit division: 0

## 2015-07-13 LAB — OPIATE, QUANTITATIVE, URINE
OPIATES: NEGATIVE
OXYCODONE+OXYMORPHONE UR QL SCN: NEGATIVE

## 2015-07-13 LAB — PREPARE RBC (CROSSMATCH)

## 2015-07-13 MED ORDER — GABAPENTIN 300 MG PO CAPS: 300.0000 mg | ORAL_CAPSULE | Freq: Two times a day (BID) | ORAL | Status: DC

## 2015-07-13 NOTE — Telephone Encounter (Signed)
Wants to know the status on her refill?

## 2015-07-13 NOTE — Telephone Encounter (Signed)
Patient prescription history reviewed; 800 mg pills prescribed by Dr. Dossie Arbour in November; 120 pills with zero refills; will start at lower dose because she has obviously not been taking 800 mg QID, as she would have run out of pills in December Rx sent

## 2015-07-14 ENCOUNTER — Telehealth: Payer: Self-pay | Admitting: Family Medicine

## 2015-07-14 MED ORDER — GABAPENTIN 800 MG PO TABS: 800.0000 mg | ORAL_TABLET | Freq: Four times a day (QID) | ORAL | Status: AC

## 2015-07-14 NOTE — Telephone Encounter (Signed)
Pt called back is agitated states does not understand why you lowered dose and changed amount of pills, and also gave no refills.  She states cannot get in to see new dr. until August states she cannot suffer in pain until then.  Pt wants you to call her?

## 2015-07-14 NOTE — Telephone Encounter (Signed)
I called pharmacy to get fill hx See previous note Last Rx was from Dr. Dossie Arbour on 02/17/15 and it was for 120 pills with zero refills per our system, so it looked like patient had run out completely months ago I did not dare start her back at 800 mg QID if she had been out That was why I reduced it, to be tapered back up by her next doctor I spoke with pharmacist; she confirmed that 800 mg QID was filled on 06/07/15 I called patient and explained, apologized for the misunderstanding on my part Do NOT take the 300 mg BID and go back to 800 mg QID (I verified 800 mg, not 400 mg as noted below) I will send new Rx

## 2015-07-14 NOTE — Telephone Encounter (Signed)
Patient states that she was taking Gabapetin 400mg  4x daily but it was changed to 300mg  2x daily. She is asking that you change it back to 400mg  because insurance will not cover it otherwise and that the 300mg  will not help. States that she is moving out of town and is needing this resolved before she leaves. Please return call [phone]

## 2015-07-22 ENCOUNTER — Encounter: Payer: Commercial Managed Care - HMO | Admitting: Obstetrics and Gynecology

## 2015-07-25 ENCOUNTER — Other Ambulatory Visit: Payer: Self-pay | Admitting: Family Medicine

## 2015-08-10 ENCOUNTER — Other Ambulatory Visit: Payer: Self-pay | Admitting: Family Medicine

## 2015-08-10 NOTE — Telephone Encounter (Signed)
Patient needs an appointment please I was under the impression that she was moving and would not be here by now If she is still in state, she needs an appt to work-up her anemia

## 2015-08-11 NOTE — Telephone Encounter (Signed)
Left message for patient to return my call so we could schedule appointment.

## 2015-08-29 ENCOUNTER — Other Ambulatory Visit: Payer: Self-pay | Admitting: Family Medicine

## 2015-08-30 NOTE — Telephone Encounter (Signed)
I will allow one month, note on Rx, needs to find doctor there

## 2015-09-25 ENCOUNTER — Telehealth: Payer: Self-pay | Admitting: Emergency Medicine

## 2015-09-25 NOTE — Telephone Encounter (Signed)
Refill Promethazine. [place]

## 2015-09-30 MED ORDER — PROMETHAZINE HCL 25 MG PO TABS: 25.0000 mg | ORAL_TABLET | Freq: Four times a day (QID) | ORAL | Status: AC | PRN

## 2015-11-22 ENCOUNTER — Other Ambulatory Visit: Payer: Self-pay | Admitting: Family Medicine
